# Patient Record
Sex: Male | Born: 1995
Health system: Western US, Academic
[De-identification: ages and names within clinical notes are randomized; demographics above are authoritative.]

## PROBLEM LIST (undated history)

## (undated) DIAGNOSIS — E119 Type 2 diabetes mellitus without complications: Secondary | ICD-10-CM

---

## 2015-01-21 ENCOUNTER — Encounter (HOSPITAL_COMMUNITY): Payer: Self-pay | Admitting: Emergency Medicine

## 2015-01-21 ENCOUNTER — Emergency Department (HOSPITAL_COMMUNITY)
Admission: EM | Admit: 2015-01-21 | Discharge: 2015-01-22 | Disposition: A | Discharge status: Home / Self Care | Attending: Emergency Medicine | Admitting: Emergency Medicine

## 2015-01-21 ENCOUNTER — Emergency Department (HOSPITAL_COMMUNITY)

## 2015-01-21 DIAGNOSIS — J028 Acute pharyngitis due to other specified organisms: Secondary | ICD-10-CM

## 2015-01-21 DIAGNOSIS — J029 Acute pharyngitis, unspecified: Secondary | ICD-10-CM

## 2015-01-21 DIAGNOSIS — Z794 Long term (current) use of insulin: Secondary | ICD-10-CM | POA: Insufficient documentation

## 2015-01-21 DIAGNOSIS — E119 Type 2 diabetes mellitus without complications: Secondary | ICD-10-CM | POA: Diagnosis not present

## 2015-01-21 DIAGNOSIS — B9789 Other viral agents as the cause of diseases classified elsewhere: Secondary | ICD-10-CM

## 2015-01-21 DIAGNOSIS — R1110 Vomiting, unspecified: Secondary | ICD-10-CM | POA: Diagnosis not present

## 2015-01-21 DIAGNOSIS — R05 Cough: Secondary | ICD-10-CM | POA: Diagnosis not present

## 2015-01-21 HISTORY — DX: Type 2 diabetes mellitus without complications: E11.9

## 2015-01-21 LAB — CBC WITH DIFFERENTIAL/PLATELET
Basophils Absolute: 0 K/uL (ref 0.0–0.1)
Basophils Relative: 0 % (ref 0–1)
Eosinophils Absolute: 0 K/uL (ref 0.0–0.7)
Eosinophils Relative: 0 % (ref 0–5)
HCT: 39.9 % (ref 39.0–52.0)
Hemoglobin: 13.4 g/dL (ref 13.0–17.0)
Lymphocytes Relative: 12 % (ref 12–46)
Lymphs Abs: 1.9 K/uL (ref 0.7–4.0)
MCH: 31.8 pg (ref 26.0–34.0)
MCHC: 33.6 g/dL (ref 30.0–36.0)
MCV: 94.8 fL (ref 78.0–100.0)
Monocytes Absolute: 1 K/uL (ref 0.1–1.0)
Monocytes Relative: 6 % (ref 3–12)
Neutro Abs: 13.4 K/uL — ABNORMAL HIGH (ref 1.7–7.7)
Neutrophils Relative %: 82 % — ABNORMAL HIGH (ref 43–77)
Platelets: 392 K/uL (ref 150–400)
RBC: 4.21 MIL/uL — ABNORMAL LOW (ref 4.22–5.81)
RDW: 12.5 % (ref 11.5–15.5)
WBC: 16.4 K/uL — ABNORMAL HIGH (ref 4.0–10.5)

## 2015-01-21 LAB — RAPID STREP SCREEN (MED CTR MEBANE ONLY): Streptococcus, Group A Screen (Direct): NEGATIVE

## 2015-01-21 LAB — COMPREHENSIVE METABOLIC PANEL WITH GFR
ALT: 28 U/L (ref 17–63)
AST: 39 U/L (ref 15–41)
Albumin: 4 g/dL (ref 3.5–5.0)
Alkaline Phosphatase: 98 U/L (ref 38–126)
Anion gap: 16 — ABNORMAL HIGH (ref 5–15)
BUN: 15 mg/dL (ref 6–20)
CO2: 16 mmol/L — ABNORMAL LOW (ref 22–32)
Calcium: 8.6 mg/dL — ABNORMAL LOW (ref 8.9–10.3)
Chloride: 108 mmol/L (ref 101–111)
Creatinine, Ser: 0.96 mg/dL (ref 0.61–1.24)
GFR calc Af Amer: >60 mL/min (ref >60)
GFR calc non Af Amer: >60 mL/min (ref >60)
Glucose, Bld: 162 mg/dL — ABNORMAL HIGH (ref 65–99)
Potassium: 4.9 mmol/L (ref 3.5–5.1)
Sodium: 140 mmol/L (ref 135–145)
Total Bilirubin: 1.2 mg/dL (ref 0.3–1.2)
Total Protein: 7.7 g/dL (ref 6.5–8.1)

## 2015-01-21 MED ORDER — SODIUM CHLORIDE 0.9 % IV BOLUS (SEPSIS)
1000.0000 mL | Freq: Once | INTRAVENOUS | Status: AC
  Administered 2015-01-21: 1000 mL via INTRAVENOUS

## 2015-01-21 MED ORDER — BENZONATATE 100 MG PO CAPS
100.0000 mg | ORAL_CAPSULE | Freq: Once | ORAL | Status: AC
  Administered 2015-01-21: 100 mg via ORAL
  Filled 2015-01-21: qty 1

## 2015-01-21 MED ORDER — LIDOCAINE VISCOUS 2 % MT SOLN
15.0000 mL | Freq: Once | OROMUCOSAL | Status: AC
  Administered 2015-01-21: 15 mL via OROMUCOSAL
  Filled 2015-01-21: qty 15

## 2015-01-21 NOTE — ED Notes (Addendum)
Bed: WA03 Expected date:  Expected time:  Means of arrival:  Comments: EMS 19 yo male sore throat, nausea and vomiting, CBG elevated 2 liters fluid

## 2015-01-21 NOTE — ED Notes (Signed)
Attempted lab draw x 2 but unsuccessful.RN, Jari Favre made aware.

## 2015-01-21 NOTE — ED Provider Notes (Signed)
CSN: [id number]     Arrival date & time 01/21/15  2101 History   First MD Initiated Contact with Patient 01/21/15 2110     Chief Complaint  Patient presents with  . Sore Throat     (Consider location/radiation/quality/duration/timing/severity/associated sxs/prior Treatment) Patient is a 19 y.o. male presenting with pharyngitis. The history is provided by the patient. No language interpreter was used.  Sore Throat This is a new problem. The problem has been gradually worsening. Associated symptoms include coughing, headaches, a sore throat and vomiting. Pertinent negatives include no abdominal pain, chest pain, chills or fever.  Mr. Harriott is a 19 y.o male with a history of DM who presents by EMS for a constant and worsening sore throat since waking up this morning and several episodes of vomiting. He is also complaining of a productive, yellow sputum cough and runny nose for the past 7 days. He denies any treatment prior to arrival. Nothing makes his symptoms better or worse. EMS reports his CBG was 174. He denies any fever, chills, drooling, trouble breathing, shortness of breath, abdominal pain, hematemesis, diarrhea, dysuria, urinary frequency. He smokes 4 cigarettes a day.  Past Medical History  Diagnosis Date  . Diabetes mellitus without complication    History reviewed. No pertinent past surgical history. History reviewed. No pertinent family history. History  Substance Use Topics  . Smoking status: Never Smoker   . Smokeless tobacco: Not on file  . Alcohol Use: No    Review of Systems  Constitutional: Negative for fever and chills.  HENT: Positive for rhinorrhea and sore throat.   Respiratory: Positive for cough. Negative for shortness of breath.   Cardiovascular: Negative for chest pain.  Gastrointestinal: Positive for vomiting. Negative for abdominal pain.  Genitourinary: Negative for dysuria.  Neurological: Positive for headaches.  All other systems reviewed and are  negative.     Allergies  Review of patient's allergies indicates no known allergies.  Home Medications   Prior to Admission medications   Medication Sig Start Date End Date Taking? Authorizing Provider  guaiFENesin (MUCINEX) 600 MG 12 hr tablet Take 600 mg by mouth 2 (two) times daily as needed for cough or to loosen phlegm.   Yes Historical Provider, MD  insulin glargine (LANTUS) 100 UNIT/ML injection Inject 24 Units into the skin at bedtime.   Yes Historical Provider, MD  insulin lispro (HUMALOG) 100 UNIT/ML injection Inject 1-6 Units into the skin 3 (three) times daily before meals. Take 1 unit per 10 grams of food.   Yes Historical Provider, MD  Pseudoephedrine-APAP-DM (DAYQUIL PO) Take 2 capsules by mouth daily as needed (runny nose).   Yes Historical Provider, MD   BP 127/64 mmHg  Pulse 89  Temp(Src) 99.2 F (37.3 C) (Oral)  Resp 18  SpO2 100% Physical Exam  Constitutional: He is oriented to person, place, and time. He appears well-developed and well-nourished. No distress.  HENT:  Head: Normocephalic and atraumatic.  Mouth/Throat: Uvula is midline and mucous membranes are normal. No trismus in the jaw. No uvula swelling. Posterior oropharyngeal erythema present. No oropharyngeal exudate, posterior oropharyngeal edema or tonsillar abscesses.  Eyes: Conjunctivae and EOM are normal.  Neck: Normal range of motion and full passive range of motion without pain. Neck supple.  No anterior cervical lymphadenopathy.  Cardiovascular: Normal rate, regular rhythm and normal heart sounds.   Pulmonary/Chest: Effort normal. No respiratory distress. He has decreased breath sounds in the right lower field and the left lower field. He has no wheezes.  Abdominal: Soft. He exhibits no distension. There is no tenderness. There is no rebound and no guarding.  Musculoskeletal: Normal range of motion.  Neurological: He is alert and oriented to person, place, and time.  Skin: Skin is warm and dry.   Psychiatric: He has a normal mood and affect. His behavior is normal.  Nursing note and vitals reviewed.   ED Course  Procedures (including critical care time) Labs Review Labs Reviewed  CBC WITH DIFFERENTIAL/PLATELET - Abnormal; Notable for the following:    WBC 16.4 (*)    RBC 4.21 (*)    Neutrophils Relative % 82 (*)    Neutro Abs 13.4 (*)    All other components within normal limits  COMPREHENSIVE METABOLIC PANEL - Abnormal; Notable for the following:    CO2 16 (*)    Glucose, Bld 162 (*)    Calcium 8.6 (*)    Anion gap 16 (*)    All other components within normal limits  RAPID STREP SCREEN (NOT AT [facility])  CULTURE, GROUP A STREP    Imaging Review Dg Chest 2 View  01/21/2015   CLINICAL DATA:  Nausea vomiting and sore throat.  Hyperglycemia.  EXAM: CHEST  2 VIEW  COMPARISON:  None.  FINDINGS: The heart size and mediastinal contours are within normal limits. Both lungs are clear. The visualized skeletal structures are unremarkable.  IMPRESSION: No active cardiopulmonary disease.   Electronically Signed   By: Ellery Plunk M.D.   On: 01/21/2015 22:01     EKG Interpretation None      MDM   Final diagnoses:  Acute viral pharyngitis   Patient is a diabetic who presents for nausea, vomiting, sore throat and URI symptoms. His vitals are stable and he is well appearing.  His anion gap is 16.   Medications  sodium chloride 0.9 % bolus 1,000 mL (0 mLs Intravenous Stopped 01/21/15 2306)  lidocaine (XYLOCAINE) 2 % viscous mouth solution 15 mL (15 mLs Mouth/Throat Given 01/21/15 2306)  benzonatate (TESSALON) capsule 100 mg (100 mg Oral Given 01/21/15 2306)  Recheck: He is feeling better after fluids.  His chest xray is negative for pneumonia. Strep is negative. He most likely has viral pharyngitis. He can take tylenol or motrin for fever. I discussed drinking fluids.  I gave him a work note as requested.  He can follow up with [facility] or the [facility] since  his pcp is in [place].  He agrees with the plan.     Catha Gosselin, PA-C 01/22/15 1610  Rolland Porter, MD 01/26/15 [phone]

## 2015-01-21 NOTE — Discharge Instructions (Signed)
Pharyngitis Use OTC chloraseptic spray for throat pain. Return for difficulty breathing or swallowing. Follow up with [facility].  Pharyngitis is a sore throat (pharynx). There is redness, pain, and swelling of your throat. HOME CARE   Drink enough fluids to keep your pee (urine) clear or pale yellow.  Only take medicine as told by your doctor.  You may get sick again if you do not take medicine as told. Finish your medicines, even if you start to feel better.  Do not take aspirin.  Rest.  Rinse your mouth (gargle) with salt water ( tsp of salt per 1 qt of water) every 1-2 hours. This will help the pain.  If you are not at risk for choking, you can suck on hard candy or sore throat lozenges. GET HELP IF:  You have large, tender lumps on your neck.  You have a rash.  You cough up green, yellow-brown, or bloody spit. GET HELP RIGHT AWAY IF:   You have a stiff neck.  You drool or cannot swallow liquids.  You throw up (vomit) or are not able to keep medicine or liquids down.  You have very bad pain that does not go away with medicine.  You have problems breathing (not from a stuffy nose). MAKE SURE YOU:   Understand these instructions.  Will watch your condition.  Will get help right away if you are not doing well or get worse. Document Released: 11/22/2007 Document Revised: 03/26/2013 Document Reviewed: 02/10/2013 [facility] Patient Information 2015 [place], [place]. This information is not intended to replace advice given to you by your health care provider. Make sure you discuss any questions you have with your health care provider.

## 2015-01-21 NOTE — ED Notes (Signed)
PER EMS- [facility] on [address] c/o n/v and sore throat. Possible hyperglycemia.  Current CBG 174. Pt arrived to ED alert and oriented x4

## 2015-01-24 LAB — CULTURE, GROUP A STREP: Strep A Culture: NEGATIVE

## 2016-05-17 DIAGNOSIS — E083291 Diabetes mellitus due to underlying condition with mild nonproliferative diabetic retinopathy without macular edema, right eye: Secondary | ICD-10-CM | POA: Diagnosis not present

## 2016-05-17 DIAGNOSIS — H5213 Myopia, bilateral: Secondary | ICD-10-CM | POA: Diagnosis not present

## 2016-05-17 DIAGNOSIS — E103291 Type 1 diabetes mellitus with mild nonproliferative diabetic retinopathy without macular edema, right eye: Secondary | ICD-10-CM | POA: Diagnosis not present

## 2016-07-01 ENCOUNTER — Encounter (HOSPITAL_COMMUNITY): Payer: Self-pay | Admitting: Emergency Medicine

## 2016-07-01 ENCOUNTER — Ambulatory Visit (HOSPITAL_COMMUNITY)
Admission: EM | Admit: 2016-07-01 | Discharge: 2016-07-01 | Disposition: A | Discharge status: Home / Self Care | Payer: BLUE CROSS/BLUE SHIELD | Attending: Internal Medicine | Admitting: Internal Medicine

## 2016-07-01 DIAGNOSIS — R739 Hyperglycemia, unspecified: Secondary | ICD-10-CM

## 2016-07-01 DIAGNOSIS — R05 Cough: Secondary | ICD-10-CM

## 2016-07-01 DIAGNOSIS — R059 Cough, unspecified: Secondary | ICD-10-CM

## 2016-07-01 DIAGNOSIS — B349 Viral infection, unspecified: Secondary | ICD-10-CM

## 2016-07-01 LAB — GLUCOSE, CAPILLARY: Glucose-Capillary: 309 mg/dL — ABNORMAL HIGH (ref 65–99)

## 2016-07-01 NOTE — ED Triage Notes (Signed)
[facility] a [facility] of Nausea and a headache x 3 days.

## 2016-07-01 NOTE — ED Provider Notes (Signed)
CSN: [id number]     Arrival date & time 07/01/16  1741 History   First MD Initiated Contact with Patient 07/01/16 1953     Chief Complaint  Patient presents with  . Headache   (Consider location/radiation/quality/duration/timing/severity/associated sxs/prior Treatment) 21 yo black male with a history of IDDM presents with 3 days of vague symptoms including non-productive cough, subjective fever "sometimes", headache and nausea once today. He feels better at time of presentation. He states that he is eating and drinking well. He denies sinus symptoms. See remainder of detailed ROS.       Past Medical History:  Diagnosis Date  . Diabetes mellitus without complication (HCC)    History reviewed. No pertinent surgical history. History reviewed. No pertinent family history. Social History  Substance Use Topics  . Smoking status: Never Smoker  . Smokeless tobacco: Not on file  . Alcohol use No    Review of Systems  Constitutional: Positive for fatigue and fever.  HENT: Negative for congestion, sinus pain, sinus pressure and sore throat.   Eyes: Negative.   Respiratory: Positive for cough. Negative for shortness of breath and wheezing.   Cardiovascular: Negative.   Gastrointestinal: Positive for nausea. Negative for constipation, diarrhea and vomiting.  Genitourinary: Negative.   Musculoskeletal: Negative.   Neurological: Positive for headaches. Negative for dizziness, seizures, light-headedness and numbness.  Psychiatric/Behavioral: Negative.     Allergies  Patient has no known allergies.  Home Medications   Prior to Admission medications   Medication Sig Start Date End Date Taking? Authorizing Provider  insulin glargine (LANTUS) 100 UNIT/ML injection Inject 24 Units into the skin at bedtime.   Yes Historical Provider, MD  insulin lispro (HUMALOG) 100 UNIT/ML injection Inject 1-6 Units into the skin 3 (three) times daily before meals. Take 1 unit per 10 grams of food.   Yes  Historical Provider, MD   Meds Ordered and Administered this Visit  Medications - No data to display  BP 108/69   Pulse 92   Temp 98.7 F (37.1 C) (Oral)   Resp 16   SpO2 98%  No data found.   Physical Exam  Constitutional: He is oriented to person, place, and time. He appears well-developed and well-nourished. No distress.  Alert and in NAD. Upright and playing a video game in bed.   HENT:  Head: Normocephalic and atraumatic.  Right Ear: External ear normal.  Left Ear: External ear normal.  Mouth/Throat: Oropharynx is clear and moist.  Neck: Normal range of motion.  Cardiovascular: Normal rate and regular rhythm.   Pulmonary/Chest: Effort normal and breath sounds normal. No respiratory distress. He has no wheezes.  Abdominal: Soft. Bowel sounds are normal. He exhibits no distension. There is no tenderness. There is no guarding.  Lymphadenopathy:    He has no cervical adenopathy.  Neurological: He is alert and oriented to person, place, and time.  Skin: Skin is warm and dry. He is not diaphoretic.  Good skin turgor  Psychiatric: His behavior is normal.  Nursing note and vitals reviewed.   Urgent Care Course   Clinical Course     Procedures (including critical care time)  Labs Review Labs Reviewed  GLUCOSE, CAPILLARY - Abnormal; Notable for the following:       Result Value   Glucose-Capillary 309 (*)    All other components within normal limits    Imaging Review No results found.   Visual Acuity Review  Right Eye Distance:   Left Eye Distance:   Bilateral Distance:  Right Eye Near:   Left Eye Near:    Bilateral Near:         MDM   1. Viral illness   2. Cough   3. Hyperglycemia    Review of Orthostatics were non-revealing. In fact repeat BP normal. I stat with elevated glucose (prior to insulin) otherwise normal. Exam normal. At this time time suspect a viral illness, no further etiology found. No emergent symptoms. Treat symptomatically and  f/u if worsens. Stable for D/C.     Riki Sheer, PA-C 07/01/16 2138

## 2016-07-01 NOTE — Discharge Instructions (Signed)
You appear to have a viral illness given symptoms. Treat with fluids and rest. Ibuprofen or Tylenol for headaches and achiness. FU with your PCP regarding your elevated blood sugars. May use Delsym for cough.

## 2016-07-08 NOTE — Progress Notes (Signed)
Subjective:    Patient ID: Scott Bush, male    DOB: Sep 14, 1995, 21 y.o.   MRN: [id number]  HPI pt is self-referred for diabetes.  Pt states DM was dx'ed in 2007; he has mild if any neuropathy of the lower extremities; he is unaware of any associated chronic complications; he has been on insulin since dx; pt says his diet and exercise are improved recently; he has never had pancreatitis.  He works as a Leisure centre managerbartender.  Last episode of severe hypoglycemia was in 2008.  He has had DKA only once, at the time of dx.  He has been out of his insulin x 2 days, but he has ins now.   Past Medical History:  Diagnosis Date  . Diabetes mellitus without complication (HCC)     No past surgical history on file.  Social History   Social History  . Marital status: Single    Spouse name: N/A  . Number of children: N/A  . Years of education: N/A   Occupational History  . Not on file.   Social History Main Topics  . Smoking status: Never Smoker  . Smokeless tobacco: Never Used  . Alcohol use No  . Drug use: Unknown  . Sexual activity: Not on file   Other Topics Concern  . Not on file   Social History Narrative  . No narrative on file    No current outpatient prescriptions on file prior to visit.   No current facility-administered medications on file prior to visit.     Allergies  Allergen Reactions  . Tomato Swelling  . Banana     GI upset   . Other     Blue cheese---Swelling     Family History  Problem Relation Age of Onset  . Diabetes type I Sister     BP 110/80   Pulse 96   Ht 5' 11.5" (1.816 m)   Wt 145 lb (65.8 kg)   SpO2 97%   BMI 19.94 kg/m    Review of Systems denies blurry vision, headache, chest pain, sob, n/v, muscle cramps, excessive diaphoresis, depression, cold intolerance, rhinorrhea, and easy bruising.  He has lost 25 lbs x 3 mos.  He has frequent urination.     Objective:   Physical Exam VS: see vs page GEN: no distress HEAD: head: no deformity eyes:  no periorbital swelling, no proptosis external nose and ears are normal mouth: no lesion seen NECK: supple, thyroid is not enlarged CHEST WALL: no deformity LUNGS: clear to auscultation CV: reg rate and rhythm, no murmur ABD: abdomen is soft, nontender.  no hepatosplenomegaly.  not distended.  no hernia MUSCULOSKELETAL: muscle bulk and strength are grossly normal.  no obvious joint swelling.  gait is normal and steady EXTEMITIES: no deformity.  no ulcer on the feet.  feet are of normal color and temp.  no edema PULSES: dorsalis pedis intact bilat.  no carotid bruit NEURO:  cn 2-12 grossly intact.   readily moves all 4's.  sensation is intact to touch on the feet SKIN:  Normal texture and temperature.  No rash or suspicious lesion is visible.  Insulin injection sites at the anterior abdomen are normal.   NODES:  None palpable at the neck.  PSYCH: alert, well-oriented.  Does not appear anxious nor depressed.    I have reviewed outside records, and summarized: Pt was noted in 2013 to have elevated a1c, but compliance was noted to be much better.  He was noted to be  in college then.   Lab Results  Component Value Date   CREATININE 0.96 01/21/2015   BUN 15 01/21/2015   NA 140 01/21/2015   K 4.9 01/21/2015   CL 108 01/21/2015   CO2 16 (L) 01/21/2015       Assessment & Plan:  Type 1 DM, new to me: poor control. Noncompliance with insulin, apparently due to cost.  Patient is advised the following: Patient Instructions  good diet and exercise significantly improve the control of your diabetes.  please let me know if you wish to be referred to a dietician.  high blood sugar is very risky to your health.  you should see an eye doctor and dentist every year.  It is very important to get all recommended vaccinations.  Controlling your blood pressure and cholesterol drastically reduces the damage diabetes does to your body.  Those who smoke should quit.  Please discuss these with your doctor.    check your blood sugar 4 times a day: before the 3 meals, and at bedtime.  also check if you have symptoms of your blood sugar being too high or too low.  please keep a record of the readings and bring it to your next appointment here (or you can bring the meter itself).  You can write it on any piece of paper.  please call us sooner if your blood sugar goes below 70, or if you have a lot of readings over 200.  I have sent prescriptions to your pharmacy, to resume the insulins.   Please come back for a follow-up appointment in 1 month.

## 2016-07-11 ENCOUNTER — Encounter: Payer: Self-pay | Admitting: Endocrinology

## 2016-07-11 ENCOUNTER — Ambulatory Visit (INDEPENDENT_AMBULATORY_CARE_PROVIDER_SITE_OTHER): Payer: BLUE CROSS/BLUE SHIELD | Admitting: Endocrinology

## 2016-07-11 DIAGNOSIS — E109 Type 1 diabetes mellitus without complications: Secondary | ICD-10-CM | POA: Diagnosis not present

## 2016-07-11 DIAGNOSIS — F4311 Post-traumatic stress disorder, acute: Secondary | ICD-10-CM | POA: Diagnosis not present

## 2016-07-11 MED ORDER — GLUCAGON (RDNA) 1 MG IJ KIT: 1.0000 mg | PACK | Freq: Once | INTRAMUSCULAR | 12 refills | Status: AC | PRN

## 2016-07-11 MED ORDER — INSULIN ASPART 100 UNIT/ML FLEXPEN
PEN_INJECTOR | SUBCUTANEOUS | 11 refills | Status: DC
Start: 2016-07-11 — End: 2016-10-09

## 2016-07-11 MED ORDER — BAYER CONTOUR NEXT MONITOR W/DEVICE KIT: 1.0000 | PACK | Freq: Once | 0 refills | Status: AC

## 2016-07-11 MED ORDER — INSULIN GLARGINE 100 UNIT/ML SOLOSTAR PEN: 22.0000 [IU] | PEN_INJECTOR | Freq: Every day | SUBCUTANEOUS | 99 refills | Status: DC

## 2016-07-11 MED ORDER — GLUCOSE BLOOD VI STRP: 1.0000 | ORAL_STRIP | Freq: Four times a day (QID) | 3 refills | Status: DC

## 2016-07-11 NOTE — Patient Instructions (Addendum)
good diet and exercise significantly improve the control of your diabetes.  please let me know if you wish to be referred to a dietician.  high blood sugar is very risky to your health.  you should see an eye doctor and dentist every year.  It is very important to get all recommended vaccinations.  Controlling your blood pressure and cholesterol drastically reduces the damage diabetes does to your body.  Those who smoke should quit.  Please discuss these with your doctor.  check your blood sugar 4 times a day: before the 3 meals, and at bedtime.  also check if you have symptoms of your blood sugar being too high or too low.  please keep a record of the readings and bring it to your next appointment here (or you can bring the meter itself).  You can write it on any piece of paper.  please call us sooner if your blood sugar goes below 70, or if you have a lot of readings over 200.  I have sent prescriptions to your pharmacy, to resume the insulins.   Please come back for a follow-up appointment in 1 month.

## 2016-07-13 DIAGNOSIS — E119 Type 2 diabetes mellitus without complications: Secondary | ICD-10-CM | POA: Insufficient documentation

## 2016-07-26 DIAGNOSIS — F331 Major depressive disorder, recurrent, moderate: Secondary | ICD-10-CM | POA: Diagnosis not present

## 2016-08-10 ENCOUNTER — Ambulatory Visit: Payer: BLUE CROSS/BLUE SHIELD | Admitting: Endocrinology

## 2016-08-15 DIAGNOSIS — F331 Major depressive disorder, recurrent, moderate: Secondary | ICD-10-CM | POA: Diagnosis not present

## 2016-09-01 ENCOUNTER — Ambulatory Visit (INDEPENDENT_AMBULATORY_CARE_PROVIDER_SITE_OTHER): Payer: BLUE CROSS/BLUE SHIELD | Admitting: Endocrinology

## 2016-09-01 ENCOUNTER — Encounter: Payer: Self-pay | Admitting: Endocrinology

## 2016-09-01 VITALS — BP 122/68 | HR 111 | Wt 152.8 lb

## 2016-09-01 DIAGNOSIS — E119 Type 2 diabetes mellitus without complications: Secondary | ICD-10-CM

## 2016-09-01 LAB — POCT GLYCOSYLATED HEMOGLOBIN (HGB A1C): Hemoglobin A1C: 11

## 2016-09-01 NOTE — Patient Instructions (Addendum)
check your blood sugar 4 times a day: before the 3 meals, and at bedtime.  also check if you have symptoms of your blood sugar being too high or too low.  please keep a record of the readings and bring it to your next appointment here (or you can bring the meter itself).  You can write it on any piece of paper.  please call us sooner if your blood sugar goes below 70, or if you have a lot of readings over 200.  Please resume the insulins. Here is some information on how to get the lantus without a copay.  If there is an alternative to either insulin you can get cheaper, call us.  We'll change if we can.   Here is a new meter.  I have sent a prescription to your pharmacy, for strips.   Please come back for a follow-up appointment in 2 months.

## 2016-09-01 NOTE — Progress Notes (Signed)
Subjective:    Patient ID: Scott Bush, male    DOB: [birth year], 21 y.o.   MRN: [id number]  HPI Pt returns for f/u of diabetes mellitus: DM type: 1 Dx'ed: 2007 Complications: none Therapy: insulin since dx DKA: only once, at dx Severe hypoglycemia: last episode was 2008 Pancreatitis: never Other: he takes multiple daily injections Interval history: He ran out of insulin 2 days ago.  He says he does not have the copay to refill.   Past Medical History:  Diagnosis Date  . Diabetes mellitus without complication (HCC)     No past surgical history on file.  Social History   Social History  . Marital status: Single    Spouse name: N/A  . Number of children: N/A  . Years of education: N/A   Occupational History  . Not on file.   Social History Main Topics  . Smoking status: Never Smoker  . Smokeless tobacco: Never Used  . Alcohol use No  . Drug use: Unknown  . Sexual activity: Not on file   Other Topics Concern  . Not on file   Social History Narrative  . No narrative on file    Current Outpatient Prescriptions on File Prior to Visit  Medication Sig Dispense Refill  . glucagon (GLUCAGON EMERGENCY) 1 MG injection Inject 1 mg into the muscle once as needed. 1 each 12  . glucose blood (BAYER CONTOUR TEST) test strip 1 each by Other route 4 (four) times daily. And lancets 4/day 400 each 3  . insulin aspart (NOVOLOG FLEXPEN) 100 UNIT/ML FlexPen 1 unit per 10 grams carbohydrate (total 25 units/day), and pen needles 4/day 15 mL 11  . Insulin Glargine (LANTUS SOLOSTAR) 100 UNIT/ML Solostar Pen Inject 22 Units into the skin at bedtime. 5 pen PRN   No current facility-administered medications on file prior to visit.     Allergies  Allergen Reactions  . Tomato Swelling  . Banana     GI upset   . Other     Blue cheese---Swelling     Family History  Problem Relation Age of Onset  . Diabetes type I Sister     BP 122/68 (BP Location: Left Arm, Patient Position:  Sitting, Cuff Size: Normal)   Pulse (!) 111   Wt 152 lb 12.8 oz (69.3 kg)   SpO2 97%   BMI 21.01 kg/m   Review of Systems He denies hypoglycemia.     Objective:   Physical Exam VITAL SIGNS:  See vs page GENERAL: no distress Pulses: dorsalis pedis intact bilat.   MSK: no deformity of the feet CV: no leg edema Skin:  no ulcer on the feet.  normal color and temp on the feet. Neuro: sensation is intact to touch on the feet.    a1c=11%     Assessment & Plan:  Type 1 DM: poor control Noncompliance with cbg recording and insulin, recurrent. Patient is advised the following: Patient Instructions  check your blood sugar 4 times a day: before the 3 meals, and at bedtime.  also check if you have symptoms of your blood sugar being too high or too low.  please keep a record of the readings and bring it to your next appointment here (or you can bring the meter itself).  You can write it on any piece of paper.  please call us sooner if your blood sugar goes below 70, or if you have a lot of readings over 200.  Please resume the insulins. Here is  some information on how to get the lantus without a copay.  If there is an alternative to either insulin you can get cheaper, call us.  We'll change if we can.   Here is a new meter.  I have sent a prescription to your pharmacy, for strips.   Please come back for a follow-up appointment in 2 months.

## 2016-09-06 DIAGNOSIS — F331 Major depressive disorder, recurrent, moderate: Secondary | ICD-10-CM | POA: Diagnosis not present

## 2016-09-13 DIAGNOSIS — F331 Major depressive disorder, recurrent, moderate: Secondary | ICD-10-CM | POA: Diagnosis not present

## 2016-10-02 ENCOUNTER — Ambulatory Visit: Payer: BLUE CROSS/BLUE SHIELD | Admitting: Endocrinology

## 2016-10-03 ENCOUNTER — Telehealth: Payer: Self-pay | Admitting: Endocrinology

## 2016-10-03 NOTE — Telephone Encounter (Signed)
Patient no showed today's appt. Please advise on how to follow up. °A. No follow up necessary. °B. Follow up urgent. Contact patient immediately. °C. Follow up necessary. Contact patient and schedule visit in ___ days. °D. Follow up advised. Contact patient and schedule visit in ____weeks. ° °

## 2016-10-03 NOTE — Telephone Encounter (Signed)
Please come back for a follow-up appointment in 1 month.  

## 2016-10-09 ENCOUNTER — Telehealth: Payer: Self-pay | Admitting: Endocrinology

## 2016-10-09 ENCOUNTER — Other Ambulatory Visit: Payer: Self-pay

## 2016-10-09 MED ORDER — INSULIN ASPART 100 UNIT/ML FLEXPEN: 35.0000 [IU] | PEN_INJECTOR | Freq: Three times a day (TID) | SUBCUTANEOUS | 11 refills | Status: DC

## 2016-10-09 NOTE — Telephone Encounter (Signed)
° ° °  insulin aspart (NOVOLOG FLEXPEN) 100 UNIT/ML FlexPen  Insulin Glargine (LANTUS SOLOSTAR) 100 UNIT/ML Solostar Pen 5 pen

## 2016-10-09 NOTE — Telephone Encounter (Signed)
Patient notified of rx being submitted.

## 2016-10-09 NOTE — Telephone Encounter (Signed)
Patient called requesting a refill on his novolog.. Patient is also asking for the dosage to be increased from 25 to 35 units. Patient stated he has been using more per the carbohydrates he is eating.

## 2016-10-09 NOTE — Telephone Encounter (Signed)
Ok, I have sent a prescription to your pharmacy 

## 2016-10-18 DIAGNOSIS — F331 Major depressive disorder, recurrent, moderate: Secondary | ICD-10-CM | POA: Diagnosis not present

## 2016-11-01 ENCOUNTER — Ambulatory Visit: Payer: BLUE CROSS/BLUE SHIELD | Admitting: Endocrinology

## 2016-11-01 DIAGNOSIS — F331 Major depressive disorder, recurrent, moderate: Secondary | ICD-10-CM | POA: Diagnosis not present

## 2016-11-15 DIAGNOSIS — F331 Major depressive disorder, recurrent, moderate: Secondary | ICD-10-CM | POA: Diagnosis not present

## 2016-12-11 ENCOUNTER — Ambulatory Visit: Payer: BLUE CROSS/BLUE SHIELD | Admitting: Endocrinology

## 2017-11-23 ENCOUNTER — Other Ambulatory Visit: Payer: Self-pay

## 2017-11-23 MED ORDER — INSULIN PEN NEEDLE 32G X 4 MM MISC: 0 refills | Status: DC

## 2018-01-29 ENCOUNTER — Other Ambulatory Visit: Payer: Self-pay | Admitting: Endocrinology

## 2018-01-31 ENCOUNTER — Telehealth: Payer: Self-pay | Admitting: Internal Medicine

## 2018-01-31 MED ORDER — INSULIN ASPART 100 UNIT/ML FLEXPEN: 35.0000 [IU] | PEN_INJECTOR | Freq: Three times a day (TID) | SUBCUTANEOUS | 0 refills | Status: DC

## 2018-01-31 NOTE — Telephone Encounter (Signed)
FYI.  Pt called and requested refill on his novolog insulin.  Reviewed chart.  He has not been seen since 08/2016.  States he went to pharmacy and there was a note to not refill the medication.  Was calling to see if could get refill tonight.  Discussed with him that he has not been seen since 08/2016  Has cancelled multiple appointments.  Discussed need for f/u appt.  Explained unable to override his regular physician.  He is currently at work.  Unable to get off now to get medication.  Discussed the need for him to call in am to schedule appt asap.  He stated he would call.  Explained to him that I would d/w Dr Everardo AllEllison regarding refill until his scheduled appt.    Addendum. Spoke to Dr Everardo AllEllison this pm regarding above situation.  Reviewed records.  Agreed for one time refill.  [facility]  Pt to call and schedule appt.  Tried calling pt multiple times to let him know that a one time insulin rx has been sent in, but that he needed to make and keep appt.  Unable to reach or leave message for pt.  Will continue to try to contact pt.

## 2018-01-31 NOTE — Telephone Encounter (Signed)
Called pt again.  Informed him that [facility].  Also informed him that he needed to call the office in the am and schedule an appt asap.  States he would call.

## 2018-02-01 ENCOUNTER — Telehealth: Payer: Self-pay | Admitting: Endocrinology

## 2018-02-01 ENCOUNTER — Telehealth: Payer: Self-pay

## 2018-02-01 NOTE — Telephone Encounter (Signed)
Per [facility]-Caller states he has been without insulin for 3 days and needs to get it from pharmacy. States pharmacy has RX for insulin, but that Romero BellingSean Ellison had put a "halt" on it and pharmacy can't fill until this is lifted. Declined triage, Blood sugar currently 278.  RN-States he takes Novolog and Lantus and that pharmacy is the [facility] on record in his file. Paging Dr. Westley Hummerharlene Scott-States caller has not been seen in office since March 2018. Instructed to advise pt to go to UC or ED to obtain insulin for use until he can be seen by Dr. Shanon Rosserannot prescribe medication without seeing patient for so long

## 2018-02-01 NOTE — Telephone Encounter (Signed)
please call patient: He needs a warning call that he will be dismissed if he continues to not come for f/u appts.  Thank you.

## 2018-02-01 NOTE — Telephone Encounter (Signed)
Patient called to set up an appointment and he wanted Dr. Everardo AllEllison to know he has an appointment scheduled so there is not an interruption in medication

## 2018-02-04 ENCOUNTER — Other Ambulatory Visit: Payer: Self-pay

## 2018-02-04 MED ORDER — INSULIN ASPART 100 UNIT/ML FLEXPEN: 35.0000 [IU] | PEN_INJECTOR | Freq: Three times a day (TID) | SUBCUTANEOUS | 0 refills | Status: DC

## 2018-02-04 MED ORDER — INSULIN GLARGINE 100 UNIT/ML SOLOSTAR PEN: 22.0000 [IU] | PEN_INJECTOR | Freq: Every day | SUBCUTANEOUS | 99 refills | Status: DC

## 2018-02-04 NOTE — Telephone Encounter (Signed)
See my previous note

## 2018-02-04 NOTE — Telephone Encounter (Signed)
I have sent 30 day & patient has been warned of dismissal if he misses again.

## 2018-02-04 NOTE — Telephone Encounter (Signed)
I have spoken with patient & he has appointment 9/20. He was only sent a temporary 14 day prescription & that wouldn't last him long enough. I have sent a 30 day & patient is aware if he misses again he will be dismissed from practice.

## 2018-03-08 ENCOUNTER — Ambulatory Visit (INDEPENDENT_AMBULATORY_CARE_PROVIDER_SITE_OTHER): Payer: BLUE CROSS/BLUE SHIELD | Admitting: Endocrinology

## 2018-03-08 ENCOUNTER — Encounter: Payer: Self-pay | Admitting: Endocrinology

## 2018-03-08 VITALS — BP 122/76 | HR 71 | Ht 71.5 in | Wt 152.4 lb

## 2018-03-08 DIAGNOSIS — E119 Type 2 diabetes mellitus without complications: Secondary | ICD-10-CM

## 2018-03-08 LAB — POCT GLYCOSYLATED HEMOGLOBIN (HGB A1C): HEMOGLOBIN A1C: 9.9 % — AB (ref 4.0–5.6)

## 2018-03-08 MED ORDER — FREESTYLE LIBRE 14 DAY SENSOR MISC: 1.0000 | 3 refills | Status: DC

## 2018-03-08 MED ORDER — FREESTYLE LIBRE 14 DAY READER DEVI: 1.0000 | Freq: Once | 0 refills | Status: AC

## 2018-03-08 MED ORDER — INSULIN ASPART 100 UNIT/ML FLEXPEN: 25.0000 [IU] | PEN_INJECTOR | Freq: Three times a day (TID) | SUBCUTANEOUS | 0 refills | Status: DC

## 2018-03-08 MED ORDER — INSULIN GLARGINE 100 UNIT/ML SOLOSTAR PEN: 15.0000 [IU] | PEN_INJECTOR | Freq: Every day | SUBCUTANEOUS | 99 refills | Status: DC

## 2018-03-08 NOTE — Progress Notes (Signed)
   Subjective:    Patient ID: Scott Bush, male    DOB: Apr 12, 1996, 22 y.o.   MRN: [id number]  HPI Pt returns for f/u of diabetes mellitus: DM type: 1 Dx'ed: 2007 Complications: none Therapy: insulin since dx DKA: only once, at dx Severe hypoglycemia: last episode was 2008 Pancreatitis: never Other: he takes multiple daily injections Interval history: he takes lantus 24 units qd, and novolog, 1 unit/10 g CHO (averages 12-15 units 3 times a day (just before each meal).  no cbg record, but states cbg's are mildly low approx once per day. This usually happens fasting. Pt says he sometimes misses the insulin.  He resumed lantus 1 month ago--was unable to afford it.     Review of Systems He denies LOC    Objective:   Physical Exam VITAL SIGNS:  See vs page GENERAL: no distress Pulses: foot pulses are intact bilaterally.   MSK: no deformity of the feet or ankles.  CV: no edema of the legs or ankles Skin:  no ulcer on the feet or ankles.  normal color and temp on the feet and ankles Neuro: sensation is intact to touch on the feet and ankles.    A1c=9.9%     Assessment & Plan:  Type 1 DM: he needs increased rx Hypoglycemia: The pattern of his cbg's indicates he needs some adjustment in his therapy Noncompliance with insulin: as he recently resumed the lantus, glycemic control is prob better than this a1c suggests.  Patient Instructions  check your blood sugar 4 times a day: before the 3 meals, and at bedtime.  also check if you have symptoms of your blood sugar being too high or too low.  please keep a record of the readings and bring it to your next appointment here (or you can bring the meter itself).  You can write it on any piece of paper.  please call us sooner if your blood sugar goes below 70, or if you have a lot of readings over 200.  Please reduce the lantus to 15 units at bedtime, and: Increase the novolog to 1 unit/8 grams of carbohydrate.  Please come back for a follow-up  appointment in 2 months.

## 2018-03-08 NOTE — Patient Instructions (Addendum)
check your blood sugar 4 times a day: before the 3 meals, and at bedtime.  also check if you have symptoms of your blood sugar being too high or too low.  please keep a record of the readings and bring it to your next appointment here (or you can bring the meter itself).  You can write it on any piece of paper.  please call us sooner if your blood sugar goes below 70, or if you have a lot of readings over 200.  Please reduce the lantus to 15 units at bedtime, and: Increase the novolog to 1 unit/8 grams of carbohydrate.  Please come back for a follow-up appointment in 2 months.

## 2018-05-08 ENCOUNTER — Ambulatory Visit: Payer: BLUE CROSS/BLUE SHIELD | Admitting: Endocrinology

## 2018-05-08 ENCOUNTER — Ambulatory Visit (INDEPENDENT_AMBULATORY_CARE_PROVIDER_SITE_OTHER): Payer: BLUE CROSS/BLUE SHIELD | Admitting: Endocrinology

## 2018-05-08 ENCOUNTER — Encounter: Payer: Self-pay | Admitting: Endocrinology

## 2018-05-08 ENCOUNTER — Telehealth: Payer: Self-pay | Admitting: Endocrinology

## 2018-05-08 ENCOUNTER — Encounter: Payer: BLUE CROSS/BLUE SHIELD | Attending: Endocrinology | Admitting: Nutrition

## 2018-05-08 VITALS — BP 122/70 | HR 67 | Ht 71.5 in | Wt 153.8 lb

## 2018-05-08 DIAGNOSIS — Z23 Encounter for immunization: Secondary | ICD-10-CM

## 2018-05-08 DIAGNOSIS — E119 Type 2 diabetes mellitus without complications: Secondary | ICD-10-CM

## 2018-05-08 DIAGNOSIS — E0801 Diabetes mellitus due to underlying condition with hyperosmolarity with coma: Secondary | ICD-10-CM

## 2018-05-08 LAB — POCT GLYCOSYLATED HEMOGLOBIN (HGB A1C): HEMOGLOBIN A1C: 10.3 % — AB (ref 4.0–5.6)

## 2018-05-08 MED ORDER — FREESTYLE LIBRE 14 DAY SENSOR MISC: 1.0000 | 3 refills | Status: DC

## 2018-05-08 NOTE — Telephone Encounter (Signed)
-----   Message from Jessica PriestLinda D Spagnola, RN sent at 05/08/2018 12:51 PM EST ----- Regarding: Freestyle Libre Please order the 14 day libre sensor.  He does not need the reader. He will use his phone. Thank you

## 2018-05-08 NOTE — Telephone Encounter (Signed)
Ok, I have sent a prescription to the pharmacy

## 2018-05-08 NOTE — Progress Notes (Signed)
Subjective:    Patient ID: Scott Bush, male    DOB: [birth year], 22 y.o.   MRN: [id number]  HPI Pt returns for f/u of diabetes mellitus: DM type: 1 Dx'ed: 2007 Complications: none Therapy: insulin since dx DKA: only once, at dx Severe hypoglycemia: last episode was 2008 Pancreatitis: never Other: he takes multiple daily injections Interval history: no cbg record, but states cbg's are mildly low approx once per day. This usually happens at work, when he misses a meal, or is active at work. Pt says he sometimes misses the insulin approx twice per week  Past Medical History:  Diagnosis Date  . Diabetes mellitus without complication (HCC)     No past surgical history on file.  Social History   Socioeconomic History  . Marital status: Single    Spouse name: Not on file  . Number of children: Not on file  . Years of education: Not on file  . Highest education level: Not on file  Occupational History  . Not on file  Social Needs  . Financial resource strain: Not on file  . Food insecurity:    Worry: Not on file    Inability: Not on file  . Transportation needs:    Medical: Not on file    Non-medical: Not on file  Tobacco Use  . Smoking status: Never Smoker  . Smokeless tobacco: Never Used  Substance and Sexual Activity  . Alcohol use: No  . Drug use: Not on file  . Sexual activity: Not on file  Lifestyle  . Physical activity:    Days per week: Not on file    Minutes per session: Not on file  . Stress: Not on file  Relationships  . Social connections:    Talks on phone: Not on file    Gets together: Not on file    Attends religious service: Not on file    Active member of club or organization: Not on file    Attends meetings of clubs or organizations: Not on file    Relationship status: Not on file  . Intimate partner violence:    Fear of current or ex partner: Not on file    Emotionally abused: Not on file    Physically abused: Not on file    Forced sexual  activity: Not on file  Other Topics Concern  . Not on file  Social History Narrative  . Not on file    Current Outpatient Medications on File Prior to Visit  Medication Sig Dispense Refill  . glucagon (GLUCAGON EMERGENCY) 1 MG injection Inject 1 mg into the muscle once as needed. 1 each 12  . glucose blood (BAYER CONTOUR TEST) test strip 1 each by Other route 4 (four) times daily. And lancets 4/day 400 each 3  . insulin aspart (NOVOLOG FLEXPEN) 100 UNIT/ML FlexPen Inject 25 Units into the skin 3 (three) times daily with meals. and pen needles 4/day 30 mL 0  . Insulin Glargine (LANTUS SOLOSTAR) 100 UNIT/ML Solostar Pen Inject 15 Units into the skin at bedtime. 30 mL PRN   No current facility-administered medications on file prior to visit.     Allergies  Allergen Reactions  . Tomato Swelling  . Banana     GI upset   . Other     Blue cheese---Swelling     Family History  Problem Relation Age of Onset  . Diabetes type I Sister     BP 122/70 (BP Location: Right Arm, Patient Position: Sitting,  Cuff Size: Normal)   Pulse 67   Ht 5' 11.5" (1.816 m)   Wt 153 lb 12.8 oz (69.8 kg)   SpO2 95%   BMI 21.15 kg/m    Review of Systems He denies LOC    Objective:   Physical Exam VITAL SIGNS:  See vs page GENERAL: no distress Pulses: dorsalis pedis intact bilat.   MSK: no deformity of the feet CV: no leg edema Skin:  no ulcer on the feet.  normal color and temp on the feet. Neuro: sensation is intact to touch on the feet   Lab Results  Component Value Date   HGBA1C 10.3 (A) 05/08/2018       Assessment & Plan:  Type 1 DM: worse.  Hypoglycemia: frequent: he is a good candidate for continuous glucose monitor.   Patient Instructions  check your blood sugar 4 times a day: before the 3 meals, and at bedtime.  also check if you have symptoms of your blood sugar being too high or too low.  please keep a record of the readings and bring it to your next appointment here (or you  can bring the meter itself).  You can write it on any piece of paper.  please call us sooner if your blood sugar goes below 70, or if you have a lot of readings over 200.  Please continue the same insulins. Please see Bonita QuinLinda today, to see bout a V-GO pump, and continuous glucose monitor.   Please come back for a follow-up appointment in 2 months.

## 2018-05-08 NOTE — Patient Instructions (Addendum)
check your blood sugar 4 times a day: before the 3 meals, and at bedtime.  also check if you have symptoms of your blood sugar being too high or too low.  please keep a record of the readings and bring it to your next appointment here (or you can bring the meter itself).  You can write it on any piece of paper.  please call us sooner if your blood sugar goes below 70, or if you have a lot of readings over 200.  Please continue the same insulins. Please see Bonita QuinLinda today, to see bout a V-GO pump, and continuous glucose monitor.   Please come back for a follow-up appointment in 2 months.

## 2018-05-09 NOTE — Progress Notes (Signed)
Scott Bush was shown the V-go and he agreed that he might like to try this.  Paperwork was filled out and faxed to [employer] to determine the cost of this.  He will call after taking with them to let me know if he would like to try this using the 6 day  free trial kit.  He was also interested in the [place].  He does not like to test his blood sugar.  We discussed the difference between blood and sensor glucose, and he was shown how to put it on.  He is wanting this.  Note to Dr. Loanne Drilling to order this. He had no final questions.

## 2018-05-09 NOTE — Patient Instructions (Signed)
Call office if you would like to try the V-Go.

## 2018-05-22 DIAGNOSIS — R6889 Other general symptoms and signs: Secondary | ICD-10-CM | POA: Diagnosis not present

## 2018-05-22 DIAGNOSIS — E1065 Type 1 diabetes mellitus with hyperglycemia: Secondary | ICD-10-CM | POA: Diagnosis not present

## 2018-07-10 ENCOUNTER — Ambulatory Visit: Payer: BLUE CROSS/BLUE SHIELD | Admitting: Endocrinology

## 2018-07-10 ENCOUNTER — Ambulatory Visit (INDEPENDENT_AMBULATORY_CARE_PROVIDER_SITE_OTHER): Payer: BLUE CROSS/BLUE SHIELD | Admitting: Endocrinology

## 2018-07-10 ENCOUNTER — Encounter: Payer: Self-pay | Admitting: Endocrinology

## 2018-07-10 VITALS — BP 108/64 | HR 94 | Ht 71.5 in | Wt 154.6 lb

## 2018-07-10 DIAGNOSIS — E109 Type 1 diabetes mellitus without complications: Secondary | ICD-10-CM | POA: Diagnosis not present

## 2018-07-10 LAB — POCT GLYCOSYLATED HEMOGLOBIN (HGB A1C): HEMOGLOBIN A1C: 8.9 % — AB (ref 4.0–5.6)

## 2018-07-10 MED ORDER — INSULIN ASPART 100 UNIT/ML FLEXPEN: 28.0000 [IU] | PEN_INJECTOR | Freq: Three times a day (TID) | SUBCUTANEOUS | 11 refills | Status: DC

## 2018-07-10 MED ORDER — INSULIN GLARGINE 100 UNIT/ML SOLOSTAR PEN: 15.0000 [IU] | PEN_INJECTOR | Freq: Every day | SUBCUTANEOUS | 99 refills | Status: DC

## 2018-07-10 NOTE — Patient Instructions (Addendum)
check your blood sugar 4 times a day: before the 3 meals, and at bedtime.  also check if you have symptoms of your blood sugar being too high or too low.  please keep a record of the readings and bring it to your next appointment here (or you can bring the meter itself).  You can write it on any piece of paper.  please call us sooner if your blood sugar goes below 70, or if you have a lot of readings over 200.   Please change the insulins to the numbers listed below.   Please come back for a follow-up appointment in 2 months.   

## 2018-07-10 NOTE — Progress Notes (Signed)
Subjective:    Patient ID: Scott Bush, male    DOB: [birth year], 23 y.o.   MRN: [id number]  HPI Pt returns for f/u of diabetes mellitus: DM type: 1 Dx'ed: 2007 Complications: none Therapy: insulin since dx DKA: only once, at dx Severe hypoglycemia: last episode was 2008 Pancreatitis: never Other: he takes multiple daily injections; he works 7 PM-4 AM; he uses [employer] continuous glucose monitor.     Interval history: I reviewed continuous glucose monitor data on pt's phone.  Glucose varies from 45-390.  It is in general higher if he has recently eaten a meal.  He seldom misses then insulin doses.  Past Medical History:  Diagnosis Date  . Diabetes mellitus without complication (HCC)     No past surgical history on file.  Social History   Socioeconomic History  . Marital status: Single    Spouse name: Not on file  . Number of children: Not on file  . Years of education: Not on file  . Highest education level: Not on file  Occupational History  . Not on file  Social Needs  . Financial resource strain: Not on file  . Food insecurity:    Worry: Not on file    Inability: Not on file  . Transportation needs:    Medical: Not on file    Non-medical: Not on file  Tobacco Use  . Smoking status: Never Smoker  . Smokeless tobacco: Never Used  Substance and Sexual Activity  . Alcohol use: No  . Drug use: Not on file  . Sexual activity: Not on file  Lifestyle  . Physical activity:    Days per week: Not on file    Minutes per session: Not on file  . Stress: Not on file  Relationships  . Social connections:    Talks on phone: Not on file    Gets together: Not on file    Attends religious service: Not on file    Active member of club or organization: Not on file    Attends meetings of clubs or organizations: Not on file    Relationship status: Not on file  . Intimate partner violence:    Fear of current or ex partner: Not on file    Emotionally abused: Not on file      Physically abused: Not on file    Forced sexual activity: Not on file  Other Topics Concern  . Not on file  Social History Narrative  . Not on file    Current Outpatient Medications on File Prior to Visit  Medication Sig Dispense Refill  . Continuous Blood Gluc Sensor (FREESTYLE LIBRE 14 DAY SENSOR) MISC 1 Device by Does not apply route every 14 (fourteen) days. 6 each 3  . glucagon (GLUCAGON EMERGENCY) 1 MG injection Inject 1 mg into the muscle once as needed. 1 each 12   No current facility-administered medications on file prior to visit.     Allergies  Allergen Reactions  . Tomato Swelling  . Banana     GI upset   . Other     Blue cheese---Swelling     Family History  Problem Relation Age of Onset  . Diabetes type I Sister     BP 108/64 (BP Location: Right Arm, Patient Position: Sitting, Cuff Size: Normal)   Pulse 94   Ht 5' 11.5" (1.816 m)   Wt 154 lb 9.6 oz (70.1 kg)   SpO2 95%   BMI 21.26 kg/m   Review  of Systems Denies LOC    Objective:   Physical Exam VITAL SIGNS:  See vs page GENERAL: no distress Pulses: dorsalis pedis intact bilat.   MSK: no deformity of the feet CV: no leg edema Skin:  no ulcer on the feet.  normal color and temp on the feet. Neuro: sensation is intact to touch on the feet.   Lab Results  Component Value Date   HGBA1C 8.9 (A) 07/10/2018       Assessment & Plan:  Type 1 DM: The pattern of his cbg's indicates he needs some adjustment in his therapy.   Hypoglycemia: this limits aggressiveness of glycemic control.   Patient Instructions  check your blood sugar 4 times a day: before the 3 meals, and at bedtime.  also check if you have symptoms of your blood sugar being too high or too low.  please keep a record of the readings and bring it to your next appointment here (or you can bring the meter itself).  You can write it on any piece of paper.  please call us sooner if your blood sugar goes below 70, or if you have a lot of  readings over 200.  Please change the insulins to the numbers listed below.  Please come back for a follow-up appointment in 2 months.

## 2018-09-04 ENCOUNTER — Other Ambulatory Visit: Payer: Self-pay

## 2018-09-04 ENCOUNTER — Ambulatory Visit (INDEPENDENT_AMBULATORY_CARE_PROVIDER_SITE_OTHER): Payer: BLUE CROSS/BLUE SHIELD | Admitting: Endocrinology

## 2018-09-04 ENCOUNTER — Encounter: Payer: Self-pay | Admitting: Endocrinology

## 2018-09-04 VITALS — BP 110/60 | HR 63 | Temp 98.5°F | Ht 71.5 in | Wt 155.2 lb

## 2018-09-04 DIAGNOSIS — E109 Type 1 diabetes mellitus without complications: Secondary | ICD-10-CM | POA: Diagnosis not present

## 2018-09-04 LAB — POCT GLYCOSYLATED HEMOGLOBIN (HGB A1C): Hemoglobin A1C: 8.2 % — AB (ref 4.0–5.6)

## 2018-09-04 MED ORDER — INSULIN GLARGINE 100 UNIT/ML SOLOSTAR PEN: 20.0000 [IU] | PEN_INJECTOR | Freq: Every day | SUBCUTANEOUS | 99 refills | Status: DC

## 2018-09-04 MED ORDER — INSULIN ASPART 100 UNIT/ML FLEXPEN: PEN_INJECTOR | SUBCUTANEOUS | 11 refills | Status: DC

## 2018-09-04 NOTE — Progress Notes (Signed)
Subjective:    Patient ID: Scott Bush, male    DOB: 1995/07/19, 23 y.o.   MRN: [id number]  HPI Pt returns for f/u of diabetes mellitus: DM type: 1 Dx'ed: 2007 Complications: none Therapy: insulin since dx DKA: only once, at dx Severe hypoglycemia: last episode was 2008 Pancreatitis: never Other: he takes multiple daily injections; he works 7 PM-4 AM; he uses [employer] continuous glucose monitor.     Interval history: I reviewed continuous glucose monitor data on pt's phone.  Glucose varies from 50-380.  It is in general higher if he has not recently eaten a meal.  It is also highest at approx 6 PM.  He seldom misses then insulin doses.  He takes lantus 15/d, and novolog 1 unit/8 g CHO (averages approx 90 total units per day).   Past Medical History:  Diagnosis Date  . Diabetes mellitus without complication (HCC)     No past surgical history on file.  Social History   Socioeconomic History  . Marital status: Single    Spouse name: Not on file  . Number of children: Not on file  . Years of education: Not on file  . Highest education level: Not on file  Occupational History  . Not on file  Social Needs  . Financial resource strain: Not on file  . Food insecurity:    Worry: Not on file    Inability: Not on file  . Transportation needs:    Medical: Not on file    Non-medical: Not on file  Tobacco Use  . Smoking status: Never Smoker  . Smokeless tobacco: Never Used  Substance and Sexual Activity  . Alcohol use: No  . Drug use: Not on file  . Sexual activity: Not on file  Lifestyle  . Physical activity:    Days per week: Not on file    Minutes per session: Not on file  . Stress: Not on file  Relationships  . Social connections:    Talks on phone: Not on file    Gets together: Not on file    Attends religious service: Not on file    Active member of club or organization: Not on file    Attends meetings of clubs or organizations: Not on file    Relationship  status: Not on file  . Intimate partner violence:    Fear of current or ex partner: Not on file    Emotionally abused: Not on file    Physically abused: Not on file    Forced sexual activity: Not on file  Other Topics Concern  . Not on file  Social History Narrative  . Not on file    Current Outpatient Medications on File Prior to Visit  Medication Sig Dispense Refill  . Continuous Blood Gluc Sensor (FREESTYLE LIBRE 14 DAY SENSOR) MISC 1 Device by Does not apply route every 14 (fourteen) days. 6 each 3  . glucagon (GLUCAGON EMERGENCY) 1 MG injection Inject 1 mg into the muscle once as needed. 1 each 12   No current facility-administered medications on file prior to visit.     Allergies  Allergen Reactions  . Tomato Swelling  . Banana     GI upset   . Other     Blue cheese---Swelling     Family History  Problem Relation Age of Onset  . Diabetes type I Sister     BP 110/60 (BP Location: Right Arm, Patient Position: Sitting, Cuff Size: Normal)   Pulse 63  Temp 98.5 F (36.9 C) (Oral)   Ht 5' 11.5" (1.816 m)   Wt 155 lb 3.2 oz (70.4 kg)   SpO2 97%   BMI 21.34 kg/m    Review of Systems Denies LOC.      Objective:   Physical Exam VITAL SIGNS:  See vs page GENERAL: no distress Pulses: dorsalis pedis intact bilat.   MSK: no deformity of the feet CV: no leg edema Skin:  no ulcer on the feet.  normal color and temp on the feet. Neuro: sensation is intact to touch on the feet Ext: There is bilateral onychomycosis of the toenails.    Lab Results  Component Value Date   HGBA1C 8.2 (A) 09/04/2018        Assessment & Plan:  Type 1 DM: he needs increased rx.  Hypoglycemia: This limits aggressiveness of glycemic control.     Patient Instructions  check your blood sugar 4 times a day: before the 3 meals, and at bedtime.  also check if you have symptoms of your blood sugar being too high or too low.  please keep a record of the readings and bring it to your next  appointment here (or you can bring the meter itself).  You can write it on any piece of paper.  please call us sooner if your blood sugar goes below 70, or if you have a lot of readings over 200.  Please increase the lantus to 20 units per day, and: Please continue the same novolog.  Please come back for a follow-up appointment in 2 months.

## 2018-09-04 NOTE — Patient Instructions (Addendum)
check your blood sugar 4 times a day: before the 3 meals, and at bedtime.  also check if you have symptoms of your blood sugar being too high or too low.  please keep a record of the readings and bring it to your next appointment here (or you can bring the meter itself).  You can write it on any piece of paper.  please call us sooner if your blood sugar goes below 70, or if you have a lot of readings over 200.  Please increase the lantus to 20 units per day, and: Please continue the same novolog.  Please come back for a follow-up appointment in 2 months.

## 2018-09-26 ENCOUNTER — Other Ambulatory Visit: Payer: Self-pay

## 2018-09-26 ENCOUNTER — Telehealth: Payer: Self-pay | Admitting: Endocrinology

## 2018-09-26 MED ORDER — INSULIN ASPART 100 UNIT/ML FLEXPEN: PEN_INJECTOR | SUBCUTANEOUS | 1 refills | Status: DC

## 2018-09-26 MED ORDER — INSULIN GLARGINE 100 UNIT/ML SOLOSTAR PEN: 20.0000 [IU] | PEN_INJECTOR | Freq: Every day | SUBCUTANEOUS | 1 refills | Status: DC

## 2018-09-26 NOTE — Telephone Encounter (Signed)
Patient called to request his insulin be sent into the pharmacy due to the COVID-19 he has lost his insurance and would like to buy in bulk,   Please advise

## 2018-09-26 NOTE — Telephone Encounter (Signed)
90 day rx sent to pt's pharmacy with refill x1

## 2018-11-04 ENCOUNTER — Ambulatory Visit: Payer: BLUE CROSS/BLUE SHIELD | Admitting: Endocrinology

## 2018-12-03 DIAGNOSIS — Z20828 Contact with and (suspected) exposure to other viral communicable diseases: Secondary | ICD-10-CM | POA: Diagnosis not present

## 2019-01-01 ENCOUNTER — Ambulatory Visit: Payer: BLUE CROSS/BLUE SHIELD | Admitting: Endocrinology

## 2019-01-01 DIAGNOSIS — Z0289 Encounter for other administrative examinations: Secondary | ICD-10-CM

## 2019-01-20 DIAGNOSIS — R05 Cough: Secondary | ICD-10-CM | POA: Diagnosis not present

## 2019-01-20 DIAGNOSIS — Z20828 Contact with and (suspected) exposure to other viral communicable diseases: Secondary | ICD-10-CM | POA: Diagnosis not present

## 2019-01-20 DIAGNOSIS — R52 Pain, unspecified: Secondary | ICD-10-CM | POA: Diagnosis not present

## 2019-01-31 ENCOUNTER — Other Ambulatory Visit: Payer: Self-pay | Admitting: Endocrinology

## 2019-01-31 MED ORDER — FREESTYLE LIBRE 14 DAY SENSOR MISC: 1.0000 | 3 refills | Status: DC

## 2019-01-31 NOTE — Telephone Encounter (Signed)
Patient ph# [phone] called re: Freestyle Libre Monitor Sensor is broken (2 brand new sensors broke in 1 day). Patient requests a RX for the following;  MEDICATION: Continuous Blood Gluc Sensor (FREESTYLE LIBRE 14 DAY SENSOR) MISC  PHARMACY:   [id number] - [place], [place] [phone] (Phone) [phone] (Fax)    IS THIS A 90 DAY SUPPLY : Yes  IS PATIENT OUT OF MEDICATION: Yes  IF NOT; HOW MUCH IS LEFT: 0 LAST APPOINTMENT DATE: @7 /15/2020  NEXT APPOINTMENT DATE:@Visit  date not found  DO WE HAVE YOUR PERMISSION TO LEAVE A DETAILED MESSAGE:  Yes  OTHER COMMENTS:    **Let patient know to contact pharmacy at the end of the day to make sure medication is ready. **  ** Please notify patient to allow 48-72 hours to process**  **Encourage patient to contact the pharmacy for refills or they can request refills through [facility]**

## 2019-01-31 NOTE — Telephone Encounter (Signed)
RX sent

## 2019-02-14 ENCOUNTER — Telehealth: Payer: Self-pay

## 2019-02-14 NOTE — Telephone Encounter (Signed)
Received notification from Cover My Meds that PA for [facility] Reader has been approved.  Zyheir Hoe Key: [id number] - Rx #: [id number] Need help? Call us at [phone] Outcome Approvedtoday Effective from 02/14/2019 through 02/13/2020. Drug FreeStyle Libre 14 Day Reader device Form [employer] [place] [employer] officer, political party Form (CB) Original Claim Info [phone]

## 2019-02-14 NOTE — Telephone Encounter (Signed)
PA initiated today through Cover My Meds for Freestyle Reader. Will await insurance response re: approval/denial.  Qunicy Garabedian Key: [id number] - Rx #: [id number] Need help? Call us at [phone] Status Additional Information Required Drug FreeStyle Libre 14 Day Reader device Form Blue Cross [place] [employer] Electronic Request Form (CB) Original Claim Info [phone]

## 2019-06-05 ENCOUNTER — Telehealth: Payer: Self-pay

## 2019-06-05 NOTE — Telephone Encounter (Signed)
LOV 09/04/18. Per Dr. Loanne Drilling, pt will require an appt before a refill can be authorized. Please call to schedule

## 2019-06-05 NOTE — Telephone Encounter (Signed)
Patient is in the process of waiting on MD switch wanting to see if he can have refill until he knows if switch is approved

## 2019-06-05 NOTE — Telephone Encounter (Signed)
MEDICATION: insulin aspart (NOVOLOG FLEXPEN) 100 UNIT/ML FlexPen  PHARMACY:  [id number] - [place], [place] - [address]  IS THIS A 90 DAY SUPPLY :   IS PATIENT OUT OF MEDICATION:   IF NOT; HOW MUCH IS LEFT:   LAST APPOINTMENT DATE: @8 /28/2020  NEXT APPOINTMENT DATE:@Visit  date not found  DO WE HAVE YOUR PERMISSION TO LEAVE A DETAILED MESSAGE:  OTHER COMMENTS:    **Let patient know to contact pharmacy at the end of the day to make sure medication is ready. **  ** Please notify patient to allow 48-72 hours to process**  **Encourage patient to contact the pharmacy for refills or they can request refills through [facility]**

## 2019-06-05 NOTE — Telephone Encounter (Signed)
1.  Please schedule f/u appt 2.  Then please refill x 1, pending that appt. 3.  I don't know what MD "switch" means

## 2019-06-05 NOTE — Telephone Encounter (Signed)
Please advise 

## 2019-06-06 NOTE — Telephone Encounter (Signed)
Per Dr. Loanne Drilling, refill cannot be processed without an appt. Realizing pt is waiting until he "switches" Md's, this still is unsafe to refill a Rx without a current appt and labs. Please call to schedule.

## 2019-06-09 NOTE — Telephone Encounter (Signed)
Patient transferred care to Dr. Kelton Pillar

## 2019-06-11 ENCOUNTER — Other Ambulatory Visit: Payer: Self-pay

## 2019-06-11 DIAGNOSIS — E109 Type 1 diabetes mellitus without complications: Secondary | ICD-10-CM

## 2019-06-11 MED ORDER — NOVOLOG FLEXPEN 100 UNIT/ML ~~LOC~~ SOPN: PEN_INJECTOR | SUBCUTANEOUS | 0 refills | Status: DC

## 2019-06-17 ENCOUNTER — Ambulatory Visit: Payer: BC Managed Care – PPO | Admitting: Internal Medicine

## 2019-06-18 ENCOUNTER — Ambulatory Visit (INDEPENDENT_AMBULATORY_CARE_PROVIDER_SITE_OTHER): Payer: Self-pay | Admitting: Internal Medicine

## 2019-06-18 ENCOUNTER — Other Ambulatory Visit: Payer: Self-pay

## 2019-06-18 ENCOUNTER — Encounter: Payer: Self-pay | Admitting: Internal Medicine

## 2019-06-18 VITALS — BP 122/72 | HR 92 | Temp 98.2°F | Ht 72.0 in | Wt 153.2 lb

## 2019-06-18 DIAGNOSIS — E109 Type 1 diabetes mellitus without complications: Secondary | ICD-10-CM

## 2019-06-18 LAB — POCT GLYCOSYLATED HEMOGLOBIN (HGB A1C): Hemoglobin A1C: 8 % — AB (ref 4.0–5.6)

## 2019-06-18 MED ORDER — TRESIBA FLEXTOUCH 100 UNIT/ML ~~LOC~~ SOPN: 12.0000 [IU] | PEN_INJECTOR | Freq: Every day | SUBCUTANEOUS | 3 refills | Status: DC

## 2019-06-18 MED ORDER — LANTUS SOLOSTAR 100 UNIT/ML ~~LOC~~ SOPN: 12.0000 [IU] | PEN_INJECTOR | Freq: Every day | SUBCUTANEOUS | 3 refills | Status: DC

## 2019-06-18 MED ORDER — INSULIN PEN NEEDLE 32G X 4 MM MISC: 1.0000 | Freq: Four times a day (QID) | 3 refills | Status: DC

## 2019-06-18 MED ORDER — NOVOLOG FLEXPEN 100 UNIT/ML ~~LOC~~ SOPN: PEN_INJECTOR | SUBCUTANEOUS | 3 refills | Status: DC

## 2019-06-18 NOTE — Progress Notes (Addendum)
Name: Scott Bush  Age/ Sex: 23 y.o., male   MRN/ DOB: [id number], 03/04/1996     PCP: Patient, No Pcp Per   Reason for Endocrinology Evaluation: Type 1 Diabetes Mellitus  Initial Endocrine Consultative Visit: 07/11/2016    PATIENT IDENTIFIER: Scott Bush is a 23 y.o. male with a past medical history of T1DM. The patient has followed with Endocrinology clinic since 07/11/2016 for consultative assistance with management of his diabetes.  DIABETIC HISTORY:  Scott Bush was diagnosed with T1DM in 2007,has hx of severe hypoglycemia in 2008. DKA at the time of diagnosis only. His hemoglobin A1c has ranged from 8.2% in 2020, peaking at 10.3% in 2019.  Sister with T1DM.  SUBJECTIVE:   During the last visit (09/04/18): A1c 8.2%. Continued MDI regimen   Today (06/18/2019): Scott Bush is here to establish care with me. He checks his blood sugars multiple times a day through the freestyle libre. The patient has had hypoglycemic episodes since the last clinic visit, which typically occur 3 x /week - most often occuring with no pattern. The patient is symptomatic with these episodes. Otherwise, the patient has not required any recent emergency interventions for hypoglycemia and has not had recent hospitalizations secondary to hyper or hypoglycemic episodes.   Eats 2 meals a day, snacks once a day   Works 9 AM - 8 PM supervisor  Lives with a friend   ROS: As per HPI and as detailed below: Review of Systems  Constitutional: Negative for chills and fever.  Respiratory: Negative for cough and shortness of breath.   Cardiovascular: Negative for chest pain and palpitations.  Gastrointestinal: Negative for diarrhea and nausea.      HOME DIABETES REGIMEN:  Lantus 20 units daily - taking 14-16 units daily  Novolog I:C ratio of 1:8        CONTINUOUS GLUCOSE MONITORING RECORD INTERPRETATION    Dates of Recording: 12/17-12/30/20   Sensor description: [employer]  Results statistics:   CGM  use % of time 93  Average and SD 201  Time in range      37  %  % Time Above 180 32  % Time above 250 27  % Time Below target 2     Glycemic patterns summary: hyperglycemia through the day  Hyperglycemic episodes  Through the day   Hypoglycemic episodes occurred early morning and sometimes during the day  Overnight periods: trends down   Preprandial periods: high     DIABETIC COMPLICATIONS: Microvascular complications:    Denies:  Neuropathy, retinopathy,   Last Eye Exam: Completed years ago   Macrovascular complications:    Denies: CAD, CVA, PVD   HISTORY:  Past Medical History:  Past Medical History:  Diagnosis Date  . Diabetes mellitus without complication [facility])     Past Surgical History: No past surgical history on file.  Social History:  reports that he has never smoked. He has never used smokeless tobacco. He reports that he does not drink alcohol. No history on file for drug. Family History:  Family History  Problem Relation Age of Onset  . Diabetes type I Sister      HOME MEDICATIONS: Allergies as of 06/18/2019      Reactions   Tomato Swelling   Banana    GI upset    Other    Blue cheese---Swelling       Medication List       Accurate as of June 18, 2019 11:43 AM. If you have  any questions, ask your nurse or doctor.        FreeStyle Libre 14 Day Sensor Misc 1 Device by Does not apply route every 14 (fourteen) days.   glucagon 1 MG injection Inject 1 mg into the muscle once as needed.   Insulin Pen Needle 32G X 4 MM Misc 1 Device by Does not apply route 4 (four) times daily. Started by: Scarlette Shorts, MD   Lantus SoloStar 100 UNIT/ML Solostar Pen Generic drug: Insulin Glargine Inject 12 Units into the skin at bedtime. What changed: how much to take Changed by: Scarlette Shorts, MD   NovoLOG FlexPen 100 UNIT/ML FlexPen Generic drug: insulin aspart Max daily 90 u/day What changed: additional  instructions Changed by: Scarlette Shorts, MD        OBJECTIVE:   Vital Signs: BP 122/72 (BP Location: Left Arm, Patient Position: Sitting, Cuff Size: Normal)   Pulse 92   Temp 98.2 F (36.8 C)   Ht 6' (1.829 m)   Wt 153 lb 3.2 oz (69.5 kg)   SpO2 98%   BMI 20.78 kg/m   Wt Readings from Last 3 Encounters:  06/18/19 153 lb 3.2 oz (69.5 kg)  09/04/18 155 lb 3.2 oz (70.4 kg)  07/10/18 154 lb 9.6 oz (70.1 kg)     Exam: General: Pt appears well and is in NAD  Neck: General: Supple without adenopathy. Thyroid: Thyroid size normal.  No goiter or nodules appreciated. No thyroid bruit.  Lungs: Clear with good BS bilat with no rales, rhonchi, or wheezes  Heart: RRR with normal S1 and S2 and no gallops; no murmurs; no rub  Abdomen: Normoactive bowel sounds, soft, nontender, without masses or organomegaly palpable  Extremities: No pretibial edema. No tremor. Normal strength and motion throughout. See detailed diabetic foot exam below.  Skin: Normal texture and temperature to palpation. No rash noted. No Acanthosis nigricans/skin tags. No lipohypertrophy.  Neuro: MS is good with appropriate affect, pt is alert and Ox3    DM foot exam:  06/18/2019  The skin of the feet is intact without sores or ulcerations. The pedal pulses are 2+ on right and 2+ on left. The sensation is intact to a screening 5.07, 10 gram monofilament bilaterally        DATA REVIEWED:  Lab Results  Component Value Date   HGBA1C 8.0 (A) 06/18/2019   HGBA1C 8.2 (A) 09/04/2018   HGBA1C 8.9 (A) 07/10/2018   Lab Results  Component Value Date   CREATININE 0.96 01/21/2015    ASSESSMENT / PLAN / RECOMMENDATIONS:   1) Type 1 Diabetes Mellitus, Poorly controlled, With out complications - Most recent A1c of 8.0 %. Goal A1c < 7.0 %.      - I have discussed with the patient the pathophysiology of diabetes. We went over the natural progression of the disease. We talked about both insulin resistance and  insulin deficiency. We stressed the importance of lifestyle changes including diet and exercise. I explained the complications associated with diabetes including retinopathy, nephropathy, neuropathy as well as increased risk of cardiovascular disease. We went over the benefit seen with glycemic control.  I explained to the patient that diabetic patients are at higher than normal risk for amputations.  - Discussed pharmacokinetics of basal/bolus insulin and the importance of taking prandial insulin with meals.  We also discussed avoiding sugar-sweetened beverages and snacks, when possible.   - Pt interested in Dexcom , he will contact his insurance to verify coverage since he  already has a freestyle libre, but doesn't;t like it.  - Will obtain labs on next visit  - Pt urged to establish care with a PCP   MEDICATIONS: - Decrease Lantus to 12 units daily (will change to tresiba base on insurance) - Novolog: Continue 1 unit for every 8 grams of carbohydrates - Correction Factor: add 2 units of Novolog to mealtime,  if  pre-meal BG is 200 or higher.    EDUCATION / INSTRUCTIONS:  BG monitoring instructions: Patient is instructed to check his blood sugars 4 times a day, before each meal and bedtime .  Call [facility] if: BG persistently < 70 or > 300. . I reviewed the Rule of 15 for the treatment of hypoglycemia in detail with the patient. Literature supplied.   2) Diabetic complications:   Eye: Does not have known diabetic retinopathy. He was urged to have a an eye exam soon.   Neuro/ Feet: Does not have known diabetic peripheral neuropathy .  Renal: Patient does not have known baseline CKD.  3) Lipids: No indications for statins at this time    Twenty five minutes was spent with the patient, > 50% was spent in counseling and education   F/U in 3 months    Signed electronically by: Lyndle HerrlichAbby Jaralla Carsten Carstarphen, MD  [facility]  [facility] [address] [place], [place] [address] Phone: [phone] FAX: [phone]   CC: Patient, No Pcp Per No address on file Phone: None  Fax: None  Return to Endocrinology clinic as below: Future Appointments  Date Time Provider Department Center  09/23/2019  1:20 PM Brock Larmon, Konrad DoloresIbtehal Jaralla, MD [facility] None

## 2019-06-18 NOTE — Addendum Note (Signed)
Addended by: Dorita Sciara on: 06/18/2019 02:13 PM   Modules accepted: Orders

## 2019-06-18 NOTE — Patient Instructions (Signed)
-   Decrease Lantus to 12 units daily  - Novolog: Continue 1 unit for every 8 grams of carbohydrates - Correction Factor: add 2 units of Novolog to mealtime insulin if your pre-meal BG is 200 or higher.    - Check with your insurance about Dexcom and let us know if they cover.   - Check sugar before each meal and bedtime     - HOW TO TREAT LOW BLOOD SUGARS (Blood sugar LESS THAN 70 MG/DL)  Please follow the RULE OF 15 for the treatment of hypoglycemia treatment (when your (blood sugars are less than 70 mg/dL)    STEP 1: Take 15 grams of carbohydrates when your blood sugar is low, which includes:   3-4 GLUCOSE TABS  OR  3-4 OZ OF JUICE OR REGULAR SODA OR  ONE TUBE OF GLUCOSE GEL     STEP 2: RECHECK blood sugar in 15 MINUTES STEP 3: If your blood sugar is still low at the 15 minute recheck --> then, go back to STEP 1 and treat AGAIN with another 15 grams of carbohydrates.

## 2019-07-04 ENCOUNTER — Telehealth: Payer: Self-pay | Admitting: Internal Medicine

## 2019-07-04 MED ORDER — DEXCOM G6 RECEIVER DEVI: 1.0000 | 0 refills | Status: AC

## 2019-07-04 MED ORDER — DEXCOM G6 TRANSMITTER MISC: 1.0000 | 4 refills | Status: DC

## 2019-07-04 MED ORDER — DEXCOM G6 SENSOR MISC: 1.0000 | 6 refills | Status: DC

## 2019-07-04 NOTE — Telephone Encounter (Signed)
Spoke to pt and informed him also according to pt chart and last OV note he was changed to [place] if Lantus would not be covered. Verified dosages w/pt

## 2019-07-04 NOTE — Telephone Encounter (Signed)
Please advise 

## 2019-07-04 NOTE — Telephone Encounter (Signed)
Patient called to advise that his insurance is changing what they are covering. Per patient his Free Style Josephine Igo needs to be changed to Dexcom.  He needs this called in ASAP - his current Free Style is running out in 2 hours.  Also states that his insulins will have to be changed as well but does't know specifically yet.    [facility] [address]  Patient back number is [phone]

## 2019-07-07 DIAGNOSIS — E119 Type 2 diabetes mellitus without complications: Secondary | ICD-10-CM | POA: Diagnosis not present

## 2019-09-04 ENCOUNTER — Telehealth: Payer: Self-pay | Admitting: Internal Medicine

## 2019-09-04 ENCOUNTER — Other Ambulatory Visit: Payer: Self-pay | Admitting: Internal Medicine

## 2019-09-04 MED ORDER — DEXCOM G6 SENSOR MISC: 1.0000 | 3 refills | Status: DC

## 2019-09-04 MED ORDER — DEXCOM G6 TRANSMITTER MISC: 1.0000 | 3 refills | Status: DC

## 2019-09-04 NOTE — Telephone Encounter (Signed)
MEDICATION: dexcom receiver and sensor  PHARMACY:   [id number] - [place], [place] - [address] Phone:  [phone]  Fax:  [phone]     IS THIS A 90 DAY SUPPLY : not sure  IS PATIENT OUT OF MEDICATION: yes  IF NOT; HOW MUCH IS LEFT:   LAST APPOINTMENT DATE: 06/18/19  NEXT APPOINTMENT DATE: 09/23/2019  DO WE HAVE YOUR PERMISSION TO LEAVE A DETAILED MESSAGE: yes  OTHER COMMENTS:    **Let patient know to contact pharmacy at the end of the day to make sure medication is ready. **  ** Please notify patient to allow 48-72 hours to process**  **Encourage patient to contact the pharmacy for refills or they can request refills through [facility]**

## 2019-09-23 ENCOUNTER — Other Ambulatory Visit: Payer: Self-pay

## 2019-09-23 ENCOUNTER — Ambulatory Visit (INDEPENDENT_AMBULATORY_CARE_PROVIDER_SITE_OTHER): Payer: BC Managed Care – PPO | Admitting: Internal Medicine

## 2019-09-23 VITALS — BP 116/72 | HR 74 | Temp 98.1°F | Ht 72.0 in | Wt 154.8 lb

## 2019-09-23 DIAGNOSIS — E109 Type 1 diabetes mellitus without complications: Secondary | ICD-10-CM | POA: Diagnosis not present

## 2019-09-23 LAB — COMPREHENSIVE METABOLIC PANEL
ALT: 26 U/L (ref 0–53)
AST: 32 U/L (ref 0–37)
Albumin: 4.2 g/dL (ref 3.5–5.2)
Alkaline Phosphatase: 95 U/L (ref 39–117)
BUN: 16 mg/dL (ref 6–23)
CO2: 29 mEq/L (ref 19–32)
Calcium: 9.4 mg/dL (ref 8.4–10.5)
Chloride: 104 mEq/L (ref 96–112)
Creatinine, Ser: 0.87 mg/dL (ref 0.40–1.50)
GFR: 128.48 mL/min (ref >60.00)
Glucose, Bld: 90 mg/dL (ref 70–99)
Potassium: 4.1 mEq/L (ref 3.5–5.1)
Sodium: 138 mEq/L (ref 135–145)
Total Bilirubin: 0.5 mg/dL (ref 0.2–1.2)
Total Protein: 7.2 g/dL (ref 6.0–8.3)

## 2019-09-23 LAB — MICROALBUMIN / CREATININE URINE RATIO
Creatinine,U: 265.6 mg/dL
Microalb Creat Ratio: 0.6 mg/g (ref 0.0–30.0)
Microalb, Ur: 1.6 mg/dL (ref 0.0–1.9)

## 2019-09-23 NOTE — Patient Instructions (Signed)
-   Decrease Tresiba to 10 units daily  - Novolog: Continue 1 unit for every 6 grams of carbohydrates - Novolog correctional insulin: ADD extra units on insulin to your meal-time Novolog dose if your blood sugars are higher than 180. Use the scale below to help guide you:   Blood sugar before meal Number of units to inject  Less than 180 0 unit  181 -  230 1 units  231-  280 2 units  281 -  330 3 units  331-  380 4 units  381 -  430 5 units  431-  480 6 units    - Check sugar before each meal and bedtime     - HOW TO TREAT LOW BLOOD SUGARS (Blood sugar LESS THAN 70 MG/DL)  Please follow the RULE OF 15 for the treatment of hypoglycemia treatment (when your (blood sugars are less than 70 mg/dL)    STEP 1: Take 15 grams of carbohydrates when your blood sugar is low, which includes:   3-4 GLUCOSE TABS  OR  3-4 OZ OF JUICE OR REGULAR SODA OR  ONE TUBE OF GLUCOSE GEL     STEP 2: RECHECK blood sugar in 15 MINUTES STEP 3: If your blood sugar is still low at the 15 minute recheck --> then, go back to STEP 1 and treat AGAIN with another 15 grams of carbohydrates.

## 2019-09-23 NOTE — Progress Notes (Signed)
Name: Scott Bush  Age/ Sex: 24 y.o., male   MRN/ DOB: [id number], [birth year]     PCP: Patient, No Pcp Per   Reason for Endocrinology Evaluation: Type 1 Diabetes Mellitus  Initial Endocrine Consultative Visit: 07/11/2016    PATIENT IDENTIFIER: Scott Bush is a 24 y.o. male with a past medical history of T1DM. The patient has followed with Endocrinology clinic since 07/11/2016 for consultative assistance with management of his diabetes.  DIABETIC HISTORY:  Scott Bush was diagnosed with T1DM in 2007,has hx of severe hypoglycemia in 2008. DKA at the time of diagnosis only. His hemoglobin A1c has ranged from 8.2% in 2020, peaking at 10.3% in 2019.  Sister with T1DM.  SUBJECTIVE:   During the last visit (06/18/19): A1c 8.2%. Adjusted  MDI regimen   Today (09/24/2019): Scott Bush is here to establish care with me. He checks his blood sugars multiple times a day through the freestyle libre. The patient has had hypoglycemic episodes since the last clinic visit, which typically occur 1 x /week - most often occuring with no pattern. The patient is symptomatic with these episodes. Otherwise, the patient has not required any recent emergency interventions for hypoglycemia and has not had recent hospitalizations secondary to hyper or hypoglycemic episodes.   Eats 2 meals a day, snacks once a day   Works 9 AM - 5 PM supervisor  Lives with a friend    Tends to take 3 units less of novolog prior to going to work     ROS: As per HPI and as detailed below: Review of Systems  Constitutional: Negative for chills and fever.  Respiratory: Negative for cough and shortness of breath.   Cardiovascular: Negative for chest pain and palpitations.  Gastrointestinal: Negative for diarrhea and nausea.      HOME DIABETES REGIMEN:  Tresiba  12 units daily Novolog I:C ratio of 1:8      CONTINUOUS GLUCOSE MONITORING RECORD INTERPRETATION    Dates of Recording: 3/24-09/23/2019   Sensor description: Dexcom   Results statistics:   CGM use % of time 100  Average and SD 165  Time in range   54%  % Time Above 180 26  % Time above 250 13  % Time Below target 5     Glycemic patterns summary: hyperglycemia through the day  Hyperglycemic episodes  Through the day   Hypoglycemic episodes occurred early morning and sometimes during the day  Overnight periods: trends down   Preprandial periods: variable     DIABETIC COMPLICATIONS: Microvascular complications:    Denies:  Neuropathy, retinopathy,   Last Eye Exam: Completed years ago   Macrovascular complications:    Denies: CAD, CVA, PVD   HISTORY:  Past Medical History:  Past Medical History:  Diagnosis Date  . Diabetes mellitus without complication [facility])     Past Surgical History: No past surgical history on file.  Social History:  reports that he has never smoked. He has never used smokeless tobacco. He reports that he does not drink alcohol. No history on file for drug. Family History:  Family History  Problem Relation Age of Onset  . Diabetes type I Sister      HOME MEDICATIONS: Allergies as of 09/23/2019      Reactions   Tomato Swelling   Banana    GI upset    Other    Blue cheese---Swelling       Medication List       Accurate as of September 23, 2019 11:59 PM. If you have any questions, ask your nurse or doctor.        Dexcom G6 Receiver Devi 1 Device by Does not apply route as directed.   Dexcom G6 Sensor Misc 1 Device by Does not apply route as directed.   Dexcom G6 Transmitter Misc 1 Device by Does not apply route as directed.   glucagon 1 MG injection Inject 1 mg into the muscle once as needed.   Insulin Pen Needle 32G X 4 MM Misc 1 Device by Does not apply route 4 (four) times daily.   NovoLOG FlexPen 100 UNIT/ML FlexPen Generic drug: insulin aspart Max daily 90 u/day   Tresiba FlexTouch 100 UNIT/ML FlexTouch Pen Generic drug: insulin degludec Inject 0.12 mLs (12 Units total) into the  skin daily.        OBJECTIVE:   Vital Signs: BP 116/72 (BP Location: Right Arm, Patient Position: Sitting, Cuff Size: Large)   Pulse 74   Temp 98.1 F (36.7 C)   Ht 6' (1.829 m)   Wt 154 lb 12.8 oz (70.2 kg)   SpO2 97%   BMI 20.99 kg/m   Wt Readings from Last 3 Encounters:  09/23/19 154 lb 12.8 oz (70.2 kg)  06/18/19 153 lb 3.2 oz (69.5 kg)  09/04/18 155 lb 3.2 oz (70.4 kg)     Exam: General: Pt appears well and is in NAD  Neck: General: Supple without adenopathy. Thyroid: Thyroid size normal.  No goiter or nodules appreciated. No thyroid bruit.  Lungs: Clear with good BS bilat with no rales, rhonchi, or wheezes  Heart: RRR with normal S1 and S2 and no gallops; no murmurs; no rub  Abdomen: Normoactive bowel sounds, soft, nontender, without masses or organomegaly palpable  Extremities: No pretibial edema. No tremor. Normal strength and motion throughout. See detailed diabetic foot exam below.  Skin: Normal texture and temperature to palpation. No rash noted. No Acanthosis nigricans/skin tags. No lipohypertrophy.  Neuro: MS is good with appropriate affect, pt is alert and Ox3    DM foot exam:  09/23/2019  The skin of the feet is intact without sores or ulcerations. The pedal pulses are 2+ on right and 2+ on left. The sensation is intact to a screening 5.07, 10 gram monofilament bilaterally        DATA REVIEWED:  Lab Results  Component Value Date   HGBA1C 8.0 (A) 06/18/2019   HGBA1C 8.2 (A) 09/04/2018   HGBA1C 8.9 (A) 07/10/2018   Lab Results  Component Value Date   MICROALBUR 1.6 09/23/2019   CREATININE 0.87 09/23/2019    ASSESSMENT / PLAN / RECOMMENDATIONS:   1) Type 1 Diabetes Mellitus, Poorly controlled, With out complications - Most recent A1c of 8.0 %. Goal A1c < 7.0 %.      - I have discussed with the patient the pathophysiology of diabetes. We went over the natural progression of the disease. We talked about both insulin resistance and insulin  deficiency. We stressed the importance of lifestyle changes including diet and exercise. I explained the complications associated with diabetes including retinopathy, nephropathy, neuropathy as well as increased risk of cardiovascular disease. We went over the benefit seen with glycemic control.  I explained to the patient that diabetic patients are at higher than normal risk for amputations.  - Discussed pharmacokinetics of basal/bolus insulin and the importance of taking prandial insulin with meals.  We also discussed avoiding sugar-sweetened beverages and snacks, when possible.   - Pt interested in Dexcom ,  he will contact his insurance to verify coverage since he already has a freestyle libre, but doesn't;t like it.  - Will obtain labs on next visit  - Pt urged to establish care with a PCP   MEDICATIONS: - Decrease Tresiba to 10 units daily  - Novolog: Continue 1 unit for every 6 grams of carbohydrates - Correction Factor: Novolog (BG-130/50)   EDUCATION / INSTRUCTIONS:  BG monitoring instructions: Patient is instructed to check his blood sugars 4 times a day, before each meal and bedtime .  Call [facility] if: BG persistently < 70 or > 300. . I reviewed the Rule of 15 for the treatment of hypoglycemia in detail with the patient. Literature supplied.   2) Diabetic complications:   Eye: Does not have known diabetic retinopathy. He was urged to have a an eye exam soon.   Neuro/ Feet: Does not have known diabetic peripheral neuropathy .   Renal: Patient does not have known baseline CKD.  3) Lipids: No indications for statins at this time     F/U in 3 months    Signed electronically by: Lyndle Herrlich, MD  [facility]  [facility] [address] [place] [place], [place] [address] Phone: [phone] FAX: [phone]   CC: Patient, No Pcp Per No address on file Phone: None  Fax: None  Return to Endocrinology  clinic as below: Future Appointments  Date Time Provider Department Center  12/26/2019  2:00 PM Fernanda Twaddell, Konrad Dolores, MD [facility] None

## 2019-09-24 ENCOUNTER — Encounter: Payer: Self-pay | Admitting: Internal Medicine

## 2019-09-24 LAB — POCT GLYCOSYLATED HEMOGLOBIN (HGB A1C): Hemoglobin A1C: 7.9 % — AB (ref 4.0–5.6)

## 2019-10-14 ENCOUNTER — Ambulatory Visit (HOSPITAL_COMMUNITY)
Admission: EM | Admit: 2019-10-14 | Discharge: 2019-10-14 | Disposition: A | Discharge status: Home / Self Care | Payer: BC Managed Care – PPO | Attending: Family Medicine | Admitting: Family Medicine

## 2019-10-14 ENCOUNTER — Ambulatory Visit (INDEPENDENT_AMBULATORY_CARE_PROVIDER_SITE_OTHER): Payer: BC Managed Care – PPO

## 2019-10-14 ENCOUNTER — Other Ambulatory Visit: Payer: Self-pay

## 2019-10-14 ENCOUNTER — Encounter (HOSPITAL_COMMUNITY): Payer: Self-pay

## 2019-10-14 DIAGNOSIS — R591 Generalized enlarged lymph nodes: Secondary | ICD-10-CM

## 2019-10-14 DIAGNOSIS — R2231 Localized swelling, mass and lump, right upper limb: Secondary | ICD-10-CM | POA: Diagnosis not present

## 2019-10-14 NOTE — Discharge Instructions (Signed)
This is most likely lymph node swelling.  Could be reaction from the vaccine. X-ray did not show any acute abnormalities This should resolve on its own with time.  If not please follow-up especially if you start developing other symptoms to include fevers, chills, pain

## 2019-10-14 NOTE — ED Triage Notes (Signed)
Patient reports he noticed a lump on his chest yesterday. Denies pain.

## 2019-10-15 NOTE — ED Provider Notes (Signed)
[facility]    CSN: [id number] Arrival date & time: 10/14/19  1129      History   Chief Complaint Chief Complaint  Patient presents with  . Mass    HPI Scott Bush is a 24 y.o. male.   Patient is a 24 year old male with past medical history of diabetes.  He presents today with a mass, swelling to right chest area under the clavicle.  He noticed this yesterday.  Denies any pain from the area, erythema.  Denies any fever, chills, body aches, joint pain.  Did receive his second Covid vaccine 1 week ago in the right arm.  No injuries to the chest, heavy lifting.  No chest pain or shortness of breath.  Has not taken anything for his symptoms.  ROS per HPI      Past Medical History:  Diagnosis Date  . Diabetes mellitus without complication [facility])     Patient Active Problem List   Diagnosis Date Noted  . Diabetes (HCC) 07/13/2016    History reviewed. No pertinent surgical history.     Home Medications    Prior to Admission medications   Medication Sig Start Date End Date Taking? Authorizing Provider  Continuous Blood Gluc Receiver (DEXCOM G6 RECEIVER) DEVI 1 Device by Does not apply route as directed. 07/04/19   Shamleffer, Konrad Dolores, MD  Continuous Blood Gluc Sensor (DEXCOM G6 SENSOR) MISC 1 Device by Does not apply route as directed. 09/04/19   Shamleffer, Konrad Dolores, MD  Continuous Blood Gluc Transmit (DEXCOM G6 TRANSMITTER) MISC 1 Device by Does not apply route as directed. 09/04/19   Shamleffer, Konrad Dolores, MD  glucagon (GLUCAGON EMERGENCY) 1 MG injection Inject 1 mg into the muscle once as needed. 07/11/16   Romero Belling, MD  insulin aspart (NOVOLOG FLEXPEN) 100 UNIT/ML FlexPen Max daily 90 u/day 06/18/19   Shamleffer, Konrad Dolores, MD  insulin degludec (TRESIBA FLEXTOUCH) 100 UNIT/ML SOPN FlexTouch Pen Inject 0.12 mLs (12 Units total) into the skin daily. 06/18/19   Shamleffer, Konrad Dolores, MD  Insulin Pen Needle 32G X 4 MM MISC 1  Device by Does not apply route 4 (four) times daily. 06/18/19   Shamleffer, Konrad Dolores, MD    Family History Family History  Problem Relation Age of Onset  . Diabetes type I Sister     Social History Social History   Tobacco Use  . Smoking status: Former Games developer  . Smokeless tobacco: Never Used  Substance Use Topics  . Alcohol use: Yes    Comment: occasionally  . Drug use: Not on file     Allergies   Tomato, Banana, and Other   Review of Systems Review of Systems   Physical Exam Triage Vital Signs ED Triage Vitals  Enc Vitals Group     BP 10/14/19 1157 112/67     Pulse Rate 10/14/19 1157 (!) 51     Resp 10/14/19 1157 13     Temp 10/14/19 1157 98.1 F (36.7 C)     Temp Source 10/14/19 1157 Oral     SpO2 10/14/19 1157 99 %     Weight --      Height --      Head Circumference --      Peak Flow --      Pain Score 10/14/19 1156 0     Pain Loc --      Pain Edu? --      Excl. in GC? --    No data found.  Updated  Vital Signs BP 112/67 (BP Location: Right Arm)   Pulse (!) 51   Temp 98.1 F (36.7 C) (Oral)   Resp 13   SpO2 99%   Visual Acuity Right Eye Distance:   Left Eye Distance:   Bilateral Distance:    Right Eye Near:   Left Eye Near:    Bilateral Near:     Physical Exam Vitals and nursing note reviewed.  Constitutional:      Appearance: Normal appearance.  HENT:     Head: Normocephalic and atraumatic.     Nose: Nose normal.  Eyes:     Conjunctiva/sclera: Conjunctivae normal.  Pulmonary:     Effort: Pulmonary effort is normal.  Chest:     Chest wall: Mass and swelling present. No tenderness.       Comments: Soft mass under clavicle   No redness, drainage.  Non tender.   Musculoskeletal:        General: Normal range of motion.     Cervical back: Normal range of motion.  Skin:    General: Skin is warm and dry.  Neurological:     Mental Status: He is alert.  Psychiatric:        Mood and Affect: Mood normal.      UC  Treatments / Results  Labs (all labs ordered are listed, but only abnormal results are displayed) Labs Reviewed - No data to display  EKG   Radiology DG Clavicle Right  Result Date: 10/14/2019 CLINICAL DATA:  Mass under right clavicle.  No injury EXAM: RIGHT CLAVICLE - 2+ VIEWS COMPARISON:  None. FINDINGS: There is no evidence of fracture or other focal bone lesions. Soft tissues are unremarkable. IMPRESSION: Negative. Electronically Signed   By: Franchot Gallo M.D.   On: 10/14/2019 13:03    Procedures Procedures (including critical care time)  Medications Ordered in UC Medications - No data to display  Initial Impression / Assessment and Plan / UC Course  I have reviewed the triage vital signs and the nursing notes.  Pertinent labs & imaging results that were available during my care of the patient were reviewed by me and considered in my medical decision making (see chart for details).     Lymphadenopathy Most likely reaction from Covid vaccine We will have him rest, monitor the area and if he starts developing any other symptoms he will need to follow-up X-ray negative for any acute abnormalities.  Final Clinical Impressions(s) / UC Diagnoses   Final diagnoses:  Lymphadenopathy     Discharge Instructions     This is most likely lymph node swelling.  Could be reaction from the vaccine. X-ray did not show any acute abnormalities This should resolve on its own with time.  If not please follow-up especially if you start developing other symptoms to include fevers, chills, pain    ED Prescriptions    None     PDMP not reviewed this encounter.   Loura Halt A, NP 10/15/19 1055

## 2019-12-26 ENCOUNTER — Ambulatory Visit: Payer: BC Managed Care – PPO | Admitting: Internal Medicine

## 2019-12-26 NOTE — Progress Notes (Deleted)
Name: Scott Bush  Age/ Sex: 24 y.o., male   MRN/ DOB: [id number], 1995/07/21     PCP: Patient, No Pcp Per   Reason for Endocrinology Evaluation: Type 1 Diabetes Mellitus  Initial Endocrine Consultative Visit: 07/11/2016    PATIENT IDENTIFIER: [name] is a 24 y.o. male with a past medical history of T1DM. The patient has followed with Endocrinology clinic since 07/11/2016 for consultative assistance with management of his diabetes.  DIABETIC HISTORY:  Scott Bush was diagnosed with T1DM in 2007,has hx of severe hypoglycemia in 2008. DKA at the time of diagnosis only. His hemoglobin A1c has ranged from 8.2% in 2020, peaking at 10.3% in 2019.  Sister with T1DM.  SUBJECTIVE:   During the last visit (09/23/2019): A1c 8.0%. Adjusted  MDI regimen   Today (12/26/2019): Scott Bush is here to establish care with me. He checks his blood sugars multiple times a day through the freestyle libre. The patient has had hypoglycemic episodes since the last clinic visit, which typically occur 1 x /week - most often occuring with no pattern. The patient is symptomatic with these episodes.     HOME DIABETES REGIMEN:  Tresiba  10 units daily Novolog I:C ratio of 1: 6  CF: Novolog (BG-130/50)    CONTINUOUS GLUCOSE MONITORING RECORD INTERPRETATION    Dates of Recording: 3/24-09/23/2019   Sensor description: Dexcom  Results statistics:   CGM use % of time 100  Average and SD 165  Time in range   54%  % Time Above 180 26  % Time above 250 13  % Time Below target 5     Glycemic patterns summary: hyperglycemia through the day  Hyperglycemic episodes  Through the day   Hypoglycemic episodes occurred early morning and sometimes during the day  Overnight periods: trends down   Preprandial periods: variable     DIABETIC COMPLICATIONS: Microvascular complications:    Denies:  Neuropathy, retinopathy,   Last Eye Exam: Completed years ago   Macrovascular complications:    Denies: CAD, CVA,  PVD   HISTORY:  Past Medical History:  Past Medical History:  Diagnosis Date  . Diabetes mellitus without complication [facility])     Past Surgical History: No past surgical history on file.  Social History:  reports that he has quit smoking. He has never used smokeless tobacco. He reports current alcohol use. No history on file for drug use. Family History:  Family History  Problem Relation Age of Onset  . Diabetes type I Sister      HOME MEDICATIONS: Allergies as of 12/26/2019      Reactions   Tomato Swelling   Banana    GI upset    Other    Blue cheese---Swelling       Medication List       Accurate as of December 26, 2019  1:21 PM. If you have any questions, ask your nurse or doctor.        Dexcom G6 Receiver Devi 1 Device by Does not apply route as directed.   Dexcom G6 Sensor Misc 1 Device by Does not apply route as directed.   Dexcom G6 Transmitter Misc 1 Device by Does not apply route as directed.   glucagon 1 MG injection Inject 1 mg into the muscle once as needed.   Insulin Pen Needle 32G X 4 MM Misc 1 Device by Does not apply route 4 (four) times daily.   NovoLOG FlexPen 100 UNIT/ML FlexPen Generic drug: insulin aspart Max  daily 90 u/day   Tresiba FlexTouch 100 UNIT/ML FlexTouch Pen Generic drug: insulin degludec Inject 0.12 mLs (12 Units total) into the skin daily.        OBJECTIVE:   Vital Signs: There were no vitals taken for this visit.  Wt Readings from Last 3 Encounters:  09/23/19 154 lb 12.8 oz (70.2 kg)  06/18/19 153 lb 3.2 oz (69.5 kg)  09/04/18 155 lb 3.2 oz (70.4 kg)     Exam: General: Pt appears well and is in NAD  Neck: General: Supple without adenopathy. Thyroid: Thyroid size normal.  No goiter or nodules appreciated. No thyroid bruit.  Lungs: Clear with good BS bilat with no rales, rhonchi, or wheezes  Heart: RRR with normal S1 and S2 and no gallops; no murmurs; no rub  Abdomen: Normoactive bowel sounds, soft, nontender,  without masses or organomegaly palpable  Extremities: No pretibial edema. No tremor. Normal strength and motion throughout. See detailed diabetic foot exam below.  Skin: Normal texture and temperature to palpation. No rash noted. No Acanthosis nigricans/skin tags. No lipohypertrophy.  Neuro: MS is good with appropriate affect, pt is alert and Ox3    DM foot exam:  09/23/2019  The skin of the feet is intact without sores or ulcerations. The pedal pulses are 2+ on right and 2+ on left. The sensation is intact to a screening 5.07, 10 gram monofilament bilaterally        DATA REVIEWED:  Lab Results  Component Value Date   HGBA1C 7.9 (A) 09/24/2019   HGBA1C 8.0 (A) 06/18/2019   HGBA1C 8.2 (A) 09/04/2018   Lab Results  Component Value Date   MICROALBUR 1.6 09/23/2019   CREATININE 0.87 09/23/2019    ASSESSMENT / PLAN / RECOMMENDATIONS:   1) Type 1 Diabetes Mellitus, Poorly controlled, With out complications - Most recent A1c of 8.0 %. Goal A1c < 7.0 %.      - I have discussed with the patient the pathophysiology of diabetes. We went over the natural progression of the disease. We talked about both insulin resistance and insulin deficiency. We stressed the importance of lifestyle changes including diet and exercise. I explained the complications associated with diabetes including retinopathy, nephropathy, neuropathy as well as increased risk of cardiovascular disease. We went over the benefit seen with glycemic control.  I explained to the patient that diabetic patients are at higher than normal risk for amputations.  - Discussed pharmacokinetics of basal/bolus insulin and the importance of taking prandial insulin with meals.  We also discussed avoiding sugar-sweetened beverages and snacks, when possible.   - Pt interested in Dexcom , he will contact his insurance to verify coverage since he already has a freestyle libre, but doesn't;t like it.  - Will obtain labs on next visit  - Pt  urged to establish care with a PCP   MEDICATIONS: - Decrease Tresiba to 10 units daily  - Novolog: Continue 1 unit for every 6 grams of carbohydrates - Correction Factor: Novolog (BG-130/50)   EDUCATION / INSTRUCTIONS:  BG monitoring instructions: Patient is instructed to check his blood sugars 4 times a day, before each meal and bedtime .  Call [facility] if: BG persistently < 70 or > 300. . I reviewed the Rule of 15 for the treatment of hypoglycemia in detail with the patient. Literature supplied.   2) Diabetic complications:   Eye: Does not have known diabetic retinopathy. He was urged to have a an eye exam soon.   Neuro/  Feet: Does not have known diabetic peripheral neuropathy .   Renal: Patient does not have known baseline CKD.  3) Lipids: No indications for statins at this time     F/U in 3 months    Signed electronically by: Lyndle Herrlich, MD  [facility]  [facility] [address] [place] [place], [place] [address] Phone: [phone] FAX: [phone]   CC: Patient, No Pcp Per No address on file Phone: None  Fax: None  Return to Endocrinology clinic as below: Future Appointments  Date Time Provider Department Center  12/26/2019  2:00 PM Keegan Bensch, Konrad Dolores, MD [facility] None

## 2020-02-09 ENCOUNTER — Other Ambulatory Visit: Payer: Self-pay

## 2020-02-09 ENCOUNTER — Ambulatory Visit (INDEPENDENT_AMBULATORY_CARE_PROVIDER_SITE_OTHER): Payer: BC Managed Care – PPO | Admitting: Internal Medicine

## 2020-02-09 ENCOUNTER — Encounter: Payer: Self-pay | Admitting: Internal Medicine

## 2020-02-09 VITALS — BP 110/70 | HR 65 | Ht 72.0 in | Wt 150.2 lb

## 2020-02-09 DIAGNOSIS — E1065 Type 1 diabetes mellitus with hyperglycemia: Secondary | ICD-10-CM | POA: Diagnosis not present

## 2020-02-09 DIAGNOSIS — E109 Type 1 diabetes mellitus without complications: Secondary | ICD-10-CM

## 2020-02-09 DIAGNOSIS — E119 Type 2 diabetes mellitus without complications: Secondary | ICD-10-CM

## 2020-02-09 LAB — POCT GLYCOSYLATED HEMOGLOBIN (HGB A1C): Hemoglobin A1C: 7.7 % — AB (ref 4.0–5.6)

## 2020-02-09 NOTE — Patient Instructions (Addendum)
-   Decrease Tresiba to 10 units daily  - Novolog: Continue 1 unit for every 6 grams of carbohydrates   - Novolog correctional insulin: ADD extra units on insulin to your meal-time Novolog dose if your blood sugars are higher than 180. Use the scale below to help guide you:   Blood sugar before meal Number of units to inject  Less than 180 0 unit  181 -  230 1 units  231-  280 2 units  281 -  330 3 units  331-  380 4 units  381 -  430 5 units  431-  480 6 units        - HOW TO TREAT LOW BLOOD SUGARS (Blood sugar LESS THAN 70 MG/DL)  Please follow the RULE OF 15 for the treatment of hypoglycemia treatment (when your (blood sugars are less than 70 mg/dL)    STEP 1: Take 15 grams of carbohydrates when your blood sugar is low, which includes:   3-4 GLUCOSE TABS  OR  3-4 OZ OF JUICE OR REGULAR SODA OR  ONE TUBE OF GLUCOSE GEL     STEP 2: RECHECK blood sugar in 15 MINUTES STEP 3: If your blood sugar is still low at the 15 minute recheck --> then, go back to STEP 1 and treat AGAIN with another 15 grams of carbohydrates.

## 2020-02-09 NOTE — Progress Notes (Signed)
Name: Scott Bush  Age/ Sex: 24 y.o., male   MRN/ DOB: [id number], 10-19-1995     PCP: Patient, No Pcp Per   Reason for Endocrinology Evaluation: Type 1 Diabetes Mellitus  Initial Endocrine Consultative Visit: 07/11/2016    PATIENT IDENTIFIER: Scott Bush is a 24 y.o. male with a past medical history of T1DM. The patient has followed with Endocrinology clinic since 07/11/2016 for consultative assistance with management of his diabetes.  DIABETIC HISTORY:  Scott Bush was diagnosed with T1DM in 2007,has hx of severe hypoglycemia in 2008. DKA at the time of diagnosis only. His hemoglobin A1c has ranged from 8.2% in 2020, peaking at 10.3% in 2019.  Sister with T1DM.  SUBJECTIVE:   During the last visit (09/23/2019): A1c 8.0 %. Adjusted  MDI regimen   Today (02/09/2020): Scott Bush is here to establish care with me. He checks his blood sugars multiple times a day through the freestyle libre. The patient has had hypoglycemic episodes since the last clinic visit, which typically occur 1 x /week - most often occuring with no pattern. The patient is symptomatic with these episodes. O  Eats 2 meals a day, snacks once a day   Works 9 AM - 5 PM supervisor      HOME DIABETES REGIMEN:  Tresiba  10 units daily- taking 12 units  Novolog I:C ratio of 1:6 - doing 1:8 Correction Factor: Novolog (BG-130/50)    CONTINUOUS GLUCOSE MONITORING RECORD INTERPRETATION    Dates of Recording: 8/10-8/23/2021   Sensor description: Dexcom  Results statistics:   CGM use % of time 100  Average and SD 178/68  Time in range   50%  % Time Above 180 32  % Time above 250 15  % Time Below target 2      Glycemic patterns summary: hyperglycemia noted post prandial   Hyperglycemic episodes  With meals   Hypoglycemic episodes occurred early morning and sometimes during the day  Overnight periods: fluctuates  Preprandial periods: variable     DIABETIC COMPLICATIONS: Microvascular complications:     Denies:  Neuropathy, retinopathy,   Last Eye Exam: Completed years ago   Macrovascular complications:    Denies: CAD, CVA, PVD   HISTORY:  Past Medical History:  Past Medical History:  Diagnosis Date  . Diabetes mellitus without complication [facility])     Past Surgical History: No past surgical history on file.  Social History:  reports that he has quit smoking. He has never used smokeless tobacco. He reports current alcohol use. No history on file for drug use. Family History:  Family History  Problem Relation Age of Onset  . Diabetes type I Sister      HOME MEDICATIONS: Allergies as of 02/09/2020      Reactions   Tomato Swelling   Banana    GI upset    Other    Blue cheese---Swelling       Medication List       Accurate as of February 09, 2020 12:49 PM. If you have any questions, ask your nurse or doctor.        Dexcom G6 Receiver Devi 1 Device by Does not apply route as directed.   Dexcom G6 Sensor Misc 1 Device by Does not apply route as directed.   Dexcom G6 Transmitter Misc 1 Device by Does not apply route as directed.   glucagon 1 MG injection Inject 1 mg into the muscle once as needed.   Insulin Pen Needle 32G X  4 MM Misc 1 Device by Does not apply route 4 (four) times daily.   NovoLOG FlexPen 100 UNIT/ML FlexPen Generic drug: insulin aspart Max daily 90 u/day   Tresiba FlexTouch 100 UNIT/ML FlexTouch Pen Generic drug: insulin degludec Inject 0.12 mLs (12 Units total) into the skin daily.        OBJECTIVE:   Vital Signs: BP 110/70 (BP Location: Left Arm, Patient Position: Sitting, Cuff Size: Normal)   Pulse 65   Ht 6' (1.829 m)   Wt 150 lb 3.2 oz (68.1 kg)   SpO2 98%   BMI 20.37 kg/m   Wt Readings from Last 3 Encounters:  09/23/19 154 lb 12.8 oz (70.2 kg)  06/18/19 153 lb 3.2 oz (69.5 kg)  09/04/18 155 lb 3.2 oz (70.4 kg)     Exam: General: Pt appears well and is in NAD  Neck: General: Supple without  adenopathy. Thyroid: Thyroid size normal.  No goiter or nodules appreciated. No thyroid bruit.  Lungs: Clear with good BS bilat with no rales, rhonchi, or wheezes  Heart: RRR with normal S1 and S2 and no gallops; no murmurs; no rub  Abdomen: Normoactive bowel sounds, soft, nontender, without masses or organomegaly palpable  Extremities: No pretibial edema. No tremor. Normal strength and motion throughout. See detailed diabetic foot exam below.  Skin: Normal texture and temperature to palpation. No rash noted. No Acanthosis nigricans/skin tags. No lipohypertrophy.  Neuro: MS is good with appropriate affect, pt is alert and Ox3    DM foot exam:  09/23/2019  The skin of the feet is intact without sores or ulcerations. The pedal pulses are 2+ on right and 2+ on left. The sensation is intact to a screening 5.07, 10 gram monofilament bilaterally        DATA REVIEWED:  Lab Results  Component Value Date   HGBA1C 7.9 (A) 09/24/2019   HGBA1C 8.0 (A) 06/18/2019   HGBA1C 8.2 (A) 09/04/2018   Lab Results  Component Value Date   MICROALBUR 1.6 09/23/2019   CREATININE 0.87 09/23/2019    ASSESSMENT / PLAN / RECOMMENDATIONS:   1) Type 1 Diabetes Mellitus, Sub-optimally controlled, With out complications - Most recent A1c of 7.7 %. Goal A1c < 7.0 %.      - Slight improvement in A1c. Unfortunately he continued the same insulin doses that he has been on for a while , despite providing him with written instructions last visit.  - He has fear of hypoglycemia and tends to correct with BG's 150 or less. Scott Bush also has been noted with over-correction hyperglycemia.  Pt encouraged to work on certain techniques to try and work on overcoming the fear, and not to over do correction. For example today he drank a bottle of gatorade at work because his BG was 150 mg/dl, due to fear of hypoglycemia while at work by the time he came to our office his Bg was ~ 250 mg/dL. I have advised him maybe in the future  drink half of the bottle rather the full one, and so on and so forth.  - I am going to again attempt to reduce his basal insulin, as this is causing fasting hypoglycemia and when he skips meals.      MEDICATIONS: - Decrease Tresiba to 10 units daily  - Novolog: change I: C ratio to  1 unit for every 6 grams of carbohydrates - Correction Factor: Novolog (BG-130/50)   EDUCATION / INSTRUCTIONS:  BG monitoring instructions: Patient is instructed to check his  blood sugars 4 times a day, before each meal and bedtime .  Call [facility] if: BG persistently < 70 . Marland Kitchen I reviewed the Rule of 15 for the treatment of hypoglycemia in detail with the patient. Literature supplied.   2) Diabetic complications:   Eye: Does not have known diabetic retinopathy.    Neuro/ Feet: Does not have known diabetic peripheral neuropathy .   Renal: Patient does not have known baseline CKD.  3) Lipids: No indications for statins at this time     F/U in 4 months    Signed electronically by: Lyndle Herrlich, MD  [facility]  [facility] [address] [place] [place], [place] [address] Phone: [phone] FAX: [phone]   CC: Patient, No Pcp Per No address on file Phone: None  Fax: None  Return to Endocrinology clinic as below: Future Appointments  Date Time Provider Department Center  02/09/2020  3:40 PM Archie Atilano, Konrad Dolores, MD [facility] None

## 2020-02-10 DIAGNOSIS — E119 Type 2 diabetes mellitus without complications: Secondary | ICD-10-CM | POA: Insufficient documentation

## 2020-02-10 DIAGNOSIS — E1065 Type 1 diabetes mellitus with hyperglycemia: Secondary | ICD-10-CM | POA: Insufficient documentation

## 2020-06-03 ENCOUNTER — Ambulatory Visit: Payer: BC Managed Care – PPO | Admitting: Internal Medicine

## 2020-06-03 NOTE — Progress Notes (Signed)
Name: Scott Bush  Age/ Sex: 24 y.o., male   MRN/ DOB: [id number], 1996-04-05     PCP: Patient, No Pcp Per   Reason for Endocrinology Evaluation: Type 1 Diabetes Mellitus  Initial Endocrine Consultative Visit: 07/11/2016    PATIENT IDENTIFIER: [name] is a 24 y.o. male with a past medical history of T1DM. The patient has followed with Endocrinology clinic since 07/11/2016 for consultative assistance with management of his diabetes.  DIABETIC HISTORY:  [name] was diagnosed with T1DM in 2007,has hx of severe hypoglycemia in 2008. DKA at the time of diagnosis only. His hemoglobin A1c has ranged from 8.2% in 2020, peaking at 10.3% in 2019.  Sister with T1DM.  SUBJECTIVE:   During the last visit (02/09/2020): A1c 7.7 %. Adjusted  MDI regimen   Today (06/04/2020): [name] is here for a follow up on diabetes management. He checks his blood sugars multiple times a day through the freestyle libre. The patient has had hypoglycemic episodes since the last clinic visit, which typically occur after bolus at night. The patient is symptomatic with these episodes. O    Works 9 AM - 5 PM supervisor    Has skin cysts noted a month ago, denies pain, no change in size    HOME DIABETES REGIMEN:  Tresiba  10 units daily Novolog I:C ratio of 1:6  Correction Factor: Novolog (BG-130/50)     CONTINUOUS GLUCOSE MONITORING RECORD INTERPRETATION    Dates of Recording: 12/4-12/17/2021   Sensor description: Dexcom  Results statistics:   CGM use % of time 93  Average and SD 197  Time in range   38%  % Time Above 180 36  % Time above 250 23  % Time Below target 2      Glycemic patterns summary: hyperglycemia at night , better BG control during the day   Hyperglycemic episodes  Dinner/ bedtime  Hypoglycemic episodes occurred after a bolus  Overnight periods: fluctuates  DIABETIC COMPLICATIONS: Microvascular complications:    Denies:  Neuropathy, retinopathy,   Last Eye Exam:  Completed years ago   Macrovascular complications:    Denies: CAD, CVA, PVD   HISTORY:  Past Medical History:  Past Medical History:  Diagnosis Date  . Diabetes mellitus without complication [facility])     Past Surgical History: No past surgical history on file.  Social History:  reports that he has quit smoking. He has never used smokeless tobacco. He reports current alcohol use. No history on file for drug use. Family History:  Family History  Problem Relation Age of Onset  . Diabetes type I Sister      HOME MEDICATIONS: Allergies as of 06/04/2020      Reactions   Tomato Swelling   Banana    GI upset    Other    Blue cheese---Swelling       Medication List       Accurate as of June 04, 2020  8:33 AM. If you have any questions, ask your nurse or doctor.        Dexcom G6 Receiver Devi 1 Device by Does not apply route as directed.   Dexcom G6 Sensor Misc 1 Device by Does not apply route as directed.   Dexcom G6 Transmitter Misc 1 Device by Does not apply route as directed.   glucagon 1 MG injection Inject 1 mg into the muscle once as needed.   Insulin Pen Needle 32G X 4 MM Misc 1 Device by Does not apply  route 4 (four) times daily.   NovoLOG FlexPen 100 UNIT/ML FlexPen Generic drug: insulin aspart Max daily 90 u/day What changed: additional instructions   Tresiba FlexTouch 100 UNIT/ML FlexTouch Pen Generic drug: insulin degludec Inject 0.12 mLs (12 Units total) into the skin daily.        OBJECTIVE:   Vital Signs: BP 112/72   Pulse 69   Ht 6' (1.829 m)   Wt 150 lb 2 oz (68.1 kg)   SpO2 97%   BMI 20.36 kg/m   Wt Readings from Last 3 Encounters:  06/04/20 150 lb 2 oz (68.1 kg)  02/09/20 150 lb 3.2 oz (68.1 kg)  09/23/19 154 lb 12.8 oz (70.2 kg)     Exam: General: Pt appears well and is in NAD  Neck: General: Supple without adenopathy. Thyroid: Thyroid size normal.  No goiter or nodules appreciated.  Lungs: Clear to ascultation   Heart: RRR   Abdomen: soft, nontender  Extremities: No pretibial edema.   Skin: No cysts were detected on the bilateral areas of concern  Neuro: MS is good with appropriate affect, pt is alert and Ox3    DM foot exam:  09/23/2019  The skin of the feet is intact without sores or ulcerations. The pedal pulses are 2+ on right and 2+ on left. The sensation is intact to a screening 5.07, 10 gram monofilament bilaterally        DATA REVIEWED:  Lab Results  Component Value Date   HGBA1C 8.3 (A) 06/04/2020   HGBA1C 7.7 (A) 02/09/2020   HGBA1C 7.9 (A) 09/24/2019   Lab Results  Component Value Date   MICROALBUR 1.6 09/23/2019   CREATININE 0.87 09/23/2019    ASSESSMENT / PLAN / RECOMMENDATIONS:   1) Type 1 Diabetes Mellitus, Sub-optimally controlled, With out complications - Most recent A1c of 8.3 %. Goal A1c < 7.0 %.      -Pt with hyperglycemia, his Bg's are doing better then they were during the day but he continues with hyperglycemia at night due to snacking, I am not sure if he doesn;t bolus correctly or not at all , but he has been noted to give himself Novolog after hyperglycemia is noted which is followed by hypoglycemia.   - He has fear of hypoglycemia and tends to correct with BG's >250  Only.   - I will adjust his correction factor - We also discussed the T-slim , and he  Is willing to talk to our CDE, concerned about the cost     MEDICATIONS: - Continue  Tresiba  10 units daily  - Novolog: change I: C ratio to  1 unit for every 6 grams of carbohydrates - Correction Factor: Novolog (BG-130/55)   EDUCATION / INSTRUCTIONS:  BG monitoring instructions: Patient is instructed to check his blood sugars 4 times a day, before each meal and bedtime .  Call [place] [facility] if: BG persistently < 70 . Marland Kitchen I reviewed the Rule of 15 for the treatment of hypoglycemia in detail with the patient. Literature supplied.   2) Diabetic complications:   Eye: Does not  have known diabetic retinopathy.  Pt advised to have an exam   Neuro/ Feet: Does not have known diabetic peripheral neuropathy .   Renal: Patient does not have known baseline CKD.  3) Lipids: No indications for statins at this time   4) Skin Issue:  - He described ?cysts on the sides of his hips but upon further examination, feels to me like a  normal muscle . Reassurance provided at this time   Pt advised to establish with PCP    F/U in 4 months    Signed electronically by: Lyndle Herrlich, MD  [facility] Endocrinology  [facility] [address] [place] [place], [place] [address] Phone: [phone] FAX: [phone]   CC: Patient, No Pcp Per No address on file Phone: None  Fax: None  Return to Endocrinology clinic as below: Future Appointments  Date Time Provider Department Center  10/14/2020 11:10 AM Lara Palinkas, Konrad Dolores, MD [facility] None

## 2020-06-04 ENCOUNTER — Ambulatory Visit (INDEPENDENT_AMBULATORY_CARE_PROVIDER_SITE_OTHER): Payer: BC Managed Care – PPO | Admitting: Internal Medicine

## 2020-06-04 ENCOUNTER — Encounter: Payer: Self-pay | Admitting: Internal Medicine

## 2020-06-04 ENCOUNTER — Other Ambulatory Visit: Payer: Self-pay

## 2020-06-04 VITALS — BP 112/72 | HR 69 | Ht 72.0 in | Wt 150.1 lb

## 2020-06-04 DIAGNOSIS — E109 Type 1 diabetes mellitus without complications: Secondary | ICD-10-CM | POA: Diagnosis not present

## 2020-06-04 DIAGNOSIS — E1065 Type 1 diabetes mellitus with hyperglycemia: Secondary | ICD-10-CM | POA: Diagnosis not present

## 2020-06-04 LAB — POCT GLYCOSYLATED HEMOGLOBIN (HGB A1C): Hemoglobin A1C: 8.3 % — AB (ref 4.0–5.6)

## 2020-06-04 MED ORDER — NOVOLOG FLEXPEN 100 UNIT/ML ~~LOC~~ SOPN: PEN_INJECTOR | SUBCUTANEOUS | 3 refills | Status: DC

## 2020-06-04 MED ORDER — INSULIN PEN NEEDLE 32G X 4 MM MISC: 1.0000 | Freq: Four times a day (QID) | 3 refills | Status: AC

## 2020-06-04 MED ORDER — TRESIBA FLEXTOUCH 100 UNIT/ML ~~LOC~~ SOPN: 10.0000 [IU] | PEN_INJECTOR | Freq: Every day | SUBCUTANEOUS | 3 refills | Status: DC

## 2020-06-04 NOTE — Patient Instructions (Addendum)
-   Decrease Tresiba to 10 units daily  - Novolog: Continue 1 unit for every 6 grams of carbohydrates   - Novolog correctional insulin: ADD extra units on insulin to your meal-time Novolog dose if your blood sugars are higher than 185. Use the scale below to help guide you:   Blood sugar before meal Number of units to inject  Less than 185 0 unit  186 -  240 1 units  241-  295 2 units  296 - 350 3 units  351-  405 4 units  406 - 460 5 units  461-  515 6 units        - HOW TO TREAT LOW BLOOD SUGARS (Blood sugar LESS THAN 70 MG/DL)  Please follow the RULE OF 15 for the treatment of hypoglycemia treatment (when your (blood sugars are less than 70 mg/dL)    STEP 1: Take 15 grams of carbohydrates when your blood sugar is low, which includes:   3-4 GLUCOSE TABS  OR  3-4 OZ OF JUICE OR REGULAR SODA OR  ONE TUBE OF GLUCOSE GEL     STEP 2: RECHECK blood sugar in 15 MINUTES STEP 3: If your blood sugar is still low at the 15 minute recheck --> then, go back to STEP 1 and treat AGAIN with another 15 grams of carbohydrates.

## 2020-06-23 ENCOUNTER — Encounter: Payer: Self-pay | Admitting: Nutrition

## 2020-06-23 ENCOUNTER — Other Ambulatory Visit: Payer: Self-pay

## 2020-06-23 ENCOUNTER — Encounter: Payer: BC Managed Care – PPO | Attending: Internal Medicine | Admitting: Nutrition

## 2020-06-23 DIAGNOSIS — E1065 Type 1 diabetes mellitus with hyperglycemia: Secondary | ICD-10-CM | POA: Diagnosis not present

## 2020-06-23 NOTE — Progress Notes (Signed)
Patient is here today to learn about insulin pump therapy. We discussed how pumps work, and the advantages and disadvantages of insulin pump therapy.  He was shown the 3 different models and we discussed advantages and disadvantages of each model.  He chose the Tandem insulin pump, and paperwork was filled out and faxed to Tandem and Dexcom.  He was given a Dexcom to start.  Lot#: [id number], exp. 5/22.  We discussed the difference between blood sugar and CGM readings and he reported good understanding of this. HE was trained on how to use this, and readings are going to his phone.  He was linked to [facility].  He had no final questions.

## 2020-06-27 NOTE — Patient Instructions (Signed)
Review brochure and directions on Dexcom and go to web site for more information Call when pump comes in for training

## 2020-08-03 ENCOUNTER — Telehealth: Payer: Self-pay | Admitting: Internal Medicine

## 2020-08-03 MED ORDER — NOVOLOG FLEXPEN 100 UNIT/ML ~~LOC~~ SOPN: PEN_INJECTOR | SUBCUTANEOUS | 11 refills | Status: AC

## 2020-08-03 MED ORDER — INSULIN LISPRO (1 UNIT DIAL) 100 UNIT/ML (KWIKPEN): PEN_INJECTOR | SUBCUTANEOUS | 11 refills | Status: DC

## 2020-08-03 NOTE — Telephone Encounter (Signed)
Please ask him to contact insurance and provide formulary. Insurance companies cover more then 1 insulin. Also , Candie Mile is an inhalational insulin and will not be a good option for him as its a fixed dose.      The above is short acting insulin   What long acting insurance covers ?

## 2020-08-03 NOTE — Telephone Encounter (Signed)
Pt called because his insurance recently switched away from his insulin Novolog and they no longer cover this anymore. Pt requests we send a prescription for Siasp because he says insurance will cover this.  Pt is all out of insulin.   [id number], [place] - [address] AT [facility] [address] Phone:  [phone]  Fax:  [phone]

## 2020-08-03 NOTE — Telephone Encounter (Addendum)
I have called [employer]. It shows that his prescription plans prefer Novolog and Fiasp. However, the problem is that the prescription have to be 90 days supply. The rep was doing calculation as 45 mL which will give patient 15 pens of the Novolog flexpen which will cost $187.00  Ok to change back. It will run him about the same price with [employer]

## 2020-08-03 NOTE — Telephone Encounter (Signed)
Humalog sent.

## 2020-08-03 NOTE — Telephone Encounter (Signed)
Spoken to patient and notified Dr Shamleffer's comments. Verbalized understanding.   

## 2020-08-03 NOTE — Telephone Encounter (Signed)
Patient states his insurance  no longer pay for this  insulin aspart (NOVOLOG FLEXPEN) 100 UNIT/ML FlexPen [[id number]]  They will pay for Fiasp insulin

## 2020-08-03 NOTE — Telephone Encounter (Signed)
Patient called and advised that Scott Bush is an injectable medication and that is what his insurance covers.  Patient has requested a call back to [phone]

## 2020-08-03 NOTE — Telephone Encounter (Signed)
Spoken to patient and notified Dr Harvel Ricks comments. Patient stated that he will call and let us know

## 2020-09-16 ENCOUNTER — Other Ambulatory Visit: Payer: Self-pay | Admitting: Internal Medicine

## 2020-10-14 ENCOUNTER — Ambulatory Visit (INDEPENDENT_AMBULATORY_CARE_PROVIDER_SITE_OTHER): Payer: BC Managed Care – PPO | Admitting: Internal Medicine

## 2020-10-14 ENCOUNTER — Other Ambulatory Visit: Payer: Self-pay

## 2020-10-14 ENCOUNTER — Telehealth: Payer: Self-pay | Admitting: Internal Medicine

## 2020-10-14 VITALS — BP 112/72 | HR 78 | Ht 72.0 in | Wt 148.0 lb

## 2020-10-14 DIAGNOSIS — E1065 Type 1 diabetes mellitus with hyperglycemia: Secondary | ICD-10-CM

## 2020-10-14 LAB — LIPID PANEL
Cholesterol: 160 mg/dL (ref 0–200)
HDL: 49.8 mg/dL (ref >39.00)
LDL Cholesterol: 95 mg/dL (ref 0–99)
NonHDL: 110.22
Total CHOL/HDL Ratio: 3
Triglycerides: 76 mg/dL (ref 0.0–149.0)
VLDL: 15.2 mg/dL (ref 0.0–40.0)

## 2020-10-14 LAB — BASIC METABOLIC PANEL
BUN: 12 mg/dL (ref 6–23)
CO2: 29 mEq/L (ref 19–32)
Calcium: 9.7 mg/dL (ref 8.4–10.5)
Chloride: 101 mEq/L (ref 96–112)
Creatinine, Ser: 0.86 mg/dL (ref 0.40–1.50)
GFR: 119.15 mL/min (ref >60.00)
Glucose, Bld: 221 mg/dL — ABNORMAL HIGH (ref 70–99)
Potassium: 4.4 mEq/L (ref 3.5–5.1)
Sodium: 134 mEq/L — ABNORMAL LOW (ref 135–145)

## 2020-10-14 LAB — MICROALBUMIN / CREATININE URINE RATIO
Creatinine,U: 185.3 mg/dL
Microalb Creat Ratio: 0.4 mg/g (ref 0.0–30.0)
Microalb, Ur: <0.7 mg/dL (ref 0.0–1.9)

## 2020-10-14 LAB — POCT GLYCOSYLATED HEMOGLOBIN (HGB A1C): Hemoglobin A1C: 7.7 % — AB (ref 4.0–5.6)

## 2020-10-14 LAB — T4, FREE: Free T4: 0.72 ng/dL (ref 0.60–1.60)

## 2020-10-14 LAB — TSH: TSH: 1.13 u[IU]/mL (ref 0.35–4.50)

## 2020-10-14 NOTE — Progress Notes (Signed)
Name: Scott Bush  Age/ Sex: 25 y.o., male   MRN/ DOB: [id number], 1995-10-11     PCP: Patient, No Pcp Per (Inactive)   Reason for Endocrinology Evaluation: Type 1 Diabetes Mellitus  Initial Endocrine Consultative Visit: 07/11/2016    PATIENT IDENTIFIER: [name] is a 25 y.o. male with a past medical history of T1DM. The patient has followed with Endocrinology clinic since 07/11/2016 for consultative assistance with management of his diabetes.  DIABETIC HISTORY:  [name] was diagnosed with T1DM in 2007,has hx of severe hypoglycemia in 2008. DKA at the time of diagnosis only. His hemoglobin A1c has ranged from 8.2% in 2020, peaking at 10.3% in 2019.  Sister with T1DM.  SUBJECTIVE:   During the last visit (06/04/2020): A1c 7.7 %. Adjusted  MDI regimen   Today (10/14/2020): Mr. [name] is here for a follow up on diabetes management. He checks his blood sugars multiple times a day through the freestyle libre. The patient has had hypoglycemic episodes since the last clinic visit, he is symptomatic with these episodes     Moving to [place] in 11/2020  HOME DIABETES REGIMEN:  Tresiba  8 units daily Novolog I:C ratio of 1:6  Correction Factor: Novolog (BG-130/55)     CONTINUOUS GLUCOSE MONITORING RECORD INTERPRETATION    Dates of Recording: 4/15-4/28/2022   Sensor description: Dexcom  Results statistics:   CGM use % of time 93  Average and SD 172  Time in range  51%  % Time Above 180 27  % Time above 250 16  % Time Below target 5      Glycemic patterns summary: hyperglycemia at night , better BG control during the day   Hyperglycemic episodes  Dinner/ bedtime  Hypoglycemic episodes occurred after a bolus  Overnight periods: fluctuates  DIABETIC COMPLICATIONS: Microvascular complications:    Denies:  Neuropathy, retinopathy,   Last Eye Exam: Completed years ago   Macrovascular complications:    Denies: CAD, CVA, PVD   HISTORY:  Past Medical History:  Past  Medical History:  Diagnosis Date  . Diabetes mellitus without complication [facility])     Past Surgical History: No past surgical history on file.  Social History:  reports that he has quit smoking. He has never used smokeless tobacco. He reports current alcohol use. No history on file for drug use. Family History:  Family History  Problem Relation Age of Onset  . Diabetes type I Sister      HOME MEDICATIONS: Allergies as of 10/14/2020      Reactions   Tomato Swelling   Banana    GI upset    Other    Blue cheese---Swelling       Medication List       Accurate as of October 14, 2020  7:15 AM. If you have any questions, ask your nurse or doctor.        Dexcom G6 Receiver Devi 1 Device by Does not apply route as directed.   Dexcom G6 Sensor Misc 1 DEVICE BY DOES NOT APPLY ROUTE AS DIRECTED.   Dexcom G6 Transmitter Misc 1 Device by Does not apply route as directed.   glucagon 1 MG injection Inject 1 mg into the muscle once as needed.   Insulin Pen Needle 32G X 4 MM Misc 1 Device by Does not apply route 4 (four) times daily.   NovoLOG FlexPen 100 UNIT/ML FlexPen Generic drug: insulin aspart Max daily 50 units   Tresiba FlexTouch 100 UNIT/ML FlexTouch Pen  Generic drug: insulin degludec Inject 10 Units into the skin daily.        OBJECTIVE:   Vital Signs: BP 112/72   Pulse 78   Ht 6' (1.829 m)   Wt 148 lb (67.1 kg)   SpO2 98%   BMI 20.07 kg/m    Wt Readings from Last 3 Encounters:  06/04/20 150 lb 2 oz (68.1 kg)  02/09/20 150 lb 3.2 oz (68.1 kg)  09/23/19 154 lb 12.8 oz (70.2 kg)     Exam: General: Pt appears well and is in NAD  Neck: General: Supple without adenopathy. Thyroid: Thyroid size normal.  No goiter or nodules appreciated.  Lungs: Clear to ascultation  Heart: RRR   Abdomen: soft, nontender  Extremities: No pretibial edema.   Skin: No cysts were detected on the bilateral areas of concern  Neuro: MS is good with appropriate affect, pt is  alert and Ox3    DM foot exam:   10/14/2020  The skin of the feet is intact without sores or ulcerations. The pedal pulses are 2+ on right and 2+ on left. The sensation is intact to a screening 5.07, 10 gram monofilament bilaterally        DATA REVIEWED:  Lab Results  Component Value Date   HGBA1C 8.3 (A) 06/04/2020   HGBA1C 7.7 (A) 02/09/2020   HGBA1C 7.9 (A) 09/24/2019   Results for [name], [name] (MRN [id number]) as of 10/15/2020 12:30  Ref. Range 10/14/2020 11:51  Sodium Latest Ref Range: 135 - 145 mEq/L 134 (L)  Potassium Latest Ref Range: 3.5 - 5.1 mEq/L 4.4  Chloride Latest Ref Range: 96 - 112 mEq/L 101  CO2 Latest Ref Range: 19 - 32 mEq/L 29  Glucose Latest Ref Range: 70 - 99 mg/dL 221 (H)  BUN Latest Ref Range: 6 - 23 mg/dL 12  Creatinine Latest Ref Range: 0.40 - 1.50 mg/dL 0.86  Calcium Latest Ref Range: 8.4 - 10.5 mg/dL 9.7  GFR Latest Ref Range: >60.00 mL/min 119.15  Total CHOL/HDL Ratio Unknown 3  Cholesterol Latest Ref Range: 0 - 200 mg/dL 160  HDL Cholesterol Latest Ref Range: >39.00 mg/dL 49.80  LDL (calc) Latest Ref Range: 0 - 99 mg/dL 95  MICROALB/CREAT RATIO Latest Ref Range: 0.0 - 30.0 mg/g 0.4  NonHDL Unknown 110.22  Triglycerides Latest Ref Range: 0.0 - 149.0 mg/dL 76.0  VLDL Latest Ref Range: 0.0 - 40.0 mg/dL 15.2  TSH Latest Ref Range: 0.35 - 4.50 uIU/mL 1.13  T4,Free(Direct) Latest Ref Range: 0.60 - 1.60 ng/dL 0.72  Creatinine,U Latest Units: mg/dL 185.3  Microalb, Ur Latest Ref Range: 0.0 - 1.9 mg/dL <0.7    ASSESSMENT / PLAN / RECOMMENDATIONS:   1) Type 1 Diabetes Mellitus, Sub-optimally controlled, With out complications - Most recent A1c of 7.7%. Goal A1c < 7.0 %.     - A1c improving but he continues with glucose excursions, he continues with fasting hypoglycemia and following a bolus at times. I will reduce his basal further for now.  -- He has fear of hypoglycemia and tends to correct with BG's >250  Only.   - He met with our CDE in 06/2020  to discuss a T-slim but he has not received anything of the pump supplies yet    MEDICATIONS: - Decrease  Tresiba 7 units daily  - Novolog: change I: C ratio to  1 unit for every 6 grams of carbohydrates - Correction Factor: Novolog (BG-130/55)   EDUCATION / INSTRUCTIONS:  BG monitoring instructions: Patient is  instructed to check his blood sugars 4 times a day, before each meal and bedtime .  Call [place] [facility] if: BG persistently < 70 . Marland Kitchen I reviewed the Rule of 15 for the treatment of hypoglycemia in detail with the patient. Literature supplied.   2) Diabetic complications:   Eye: Does not have known diabetic retinopathy.  Pt advised to have an exam   Neuro/ Feet: Does not have known diabetic peripheral neuropathy .   Renal: Patient does not have known baseline CKD.  3) Lipids: No indications for statins at this time      Pt moving to [place] in 11/2020   Signed electronically by: Mack Guise, MD  [facility] Endocrinology  [facility] [address] [place], [place] [address] Phone: [phone] FAX: [phone]   CC: Patient, No Pcp Per (Inactive) No address on file Phone: None  Fax: None  Return to Endocrinology clinic as below: Future Appointments  Date Time Provider [place]  10/14/2020 11:10 AM Dejion Grillo, Melanie Crazier, MD [facility] None

## 2020-10-14 NOTE — Patient Instructions (Signed)
-   Decrease Tresiba to 7 units daily  - Novolog: Continue 1 unit for every 6 grams of carbohydrates   - Novolog correctional insulin: ADD extra units on insulin to your meal-time Novolog dose if your blood sugars are higher than 185. Use the scale below to help guide you:   Blood sugar before meal Number of units to inject  Less than 185 0 unit  186 -  240 1 units  241-  295 2 units  296 - 350 3 units  351-  405 4 units  406 - 460 5 units  461-  515 6 units        - HOW TO TREAT LOW BLOOD SUGARS (Blood sugar LESS THAN 70 MG/DL)  Please follow the RULE OF 15 for the treatment of hypoglycemia treatment (when your (blood sugars are less than 70 mg/dL)    STEP 1: Take 15 grams of carbohydrates when your blood sugar is low, which includes:   3-4 GLUCOSE TABS  OR  3-4 OZ OF JUICE OR REGULAR SODA OR  ONE TUBE OF GLUCOSE GEL     STEP 2: RECHECK blood sugar in 15 MINUTES STEP 3: If your blood sugar is still low at the 15 minute recheck --> then, go back to STEP 1 and treat AGAIN with another 15 grams of carbohydrates.

## 2020-10-14 NOTE — Telephone Encounter (Signed)
Hi Bonita Quin,    You saw Mr. Pence back in January for pump discussion and he opted for the Tandem, He has not gotten anything from them .  Can you please follow up on this ?  He is moving to [place] in June and would like to get this started sooner then later    Thanks   Abby Raelyn Mora, MD  [facility] Endocrinology  [facility] [address] [place], [place] [address] Phone: [phone] FAX: [phone]

## 2020-10-15 ENCOUNTER — Encounter: Payer: Self-pay | Admitting: Internal Medicine

## 2020-10-18 NOTE — Telephone Encounter (Signed)
LVM to call that his paperwork was faxed with a confirmation on 06/23/20.  He was given the number to customer care, as well as Chris's number for follow-up.

## 2020-10-28 IMAGING — DX DG CLAVICLE*R*
2 series · 2 of 2 positions shown · non-contrast
Comparison: None.

CLINICAL DATA: Mass under right clavicle.  No injury

EXAM:
RIGHT CLAVICLE - 2+ VIEWS

[clavicle ap]
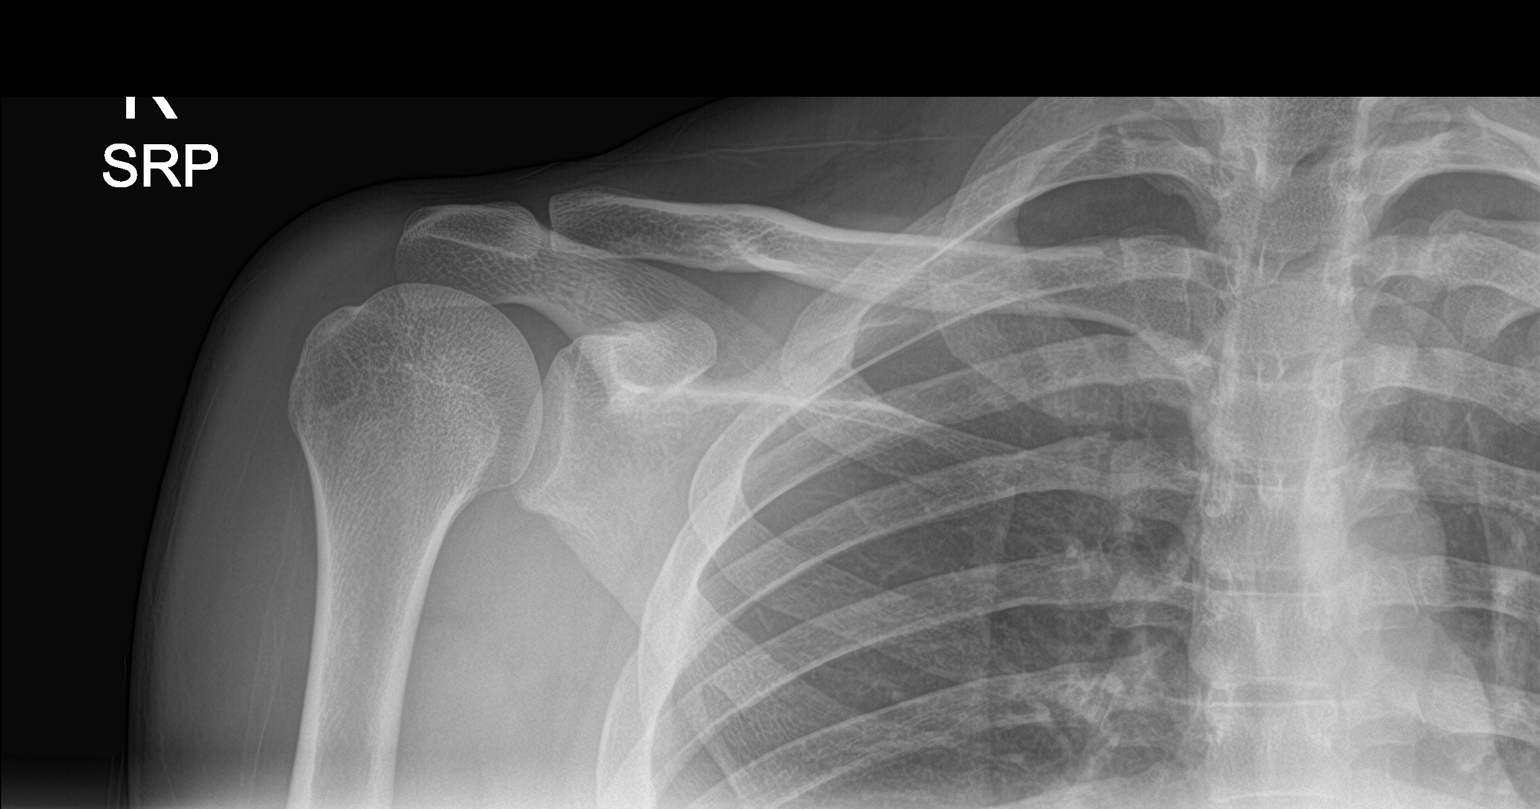

[clavicle axial]
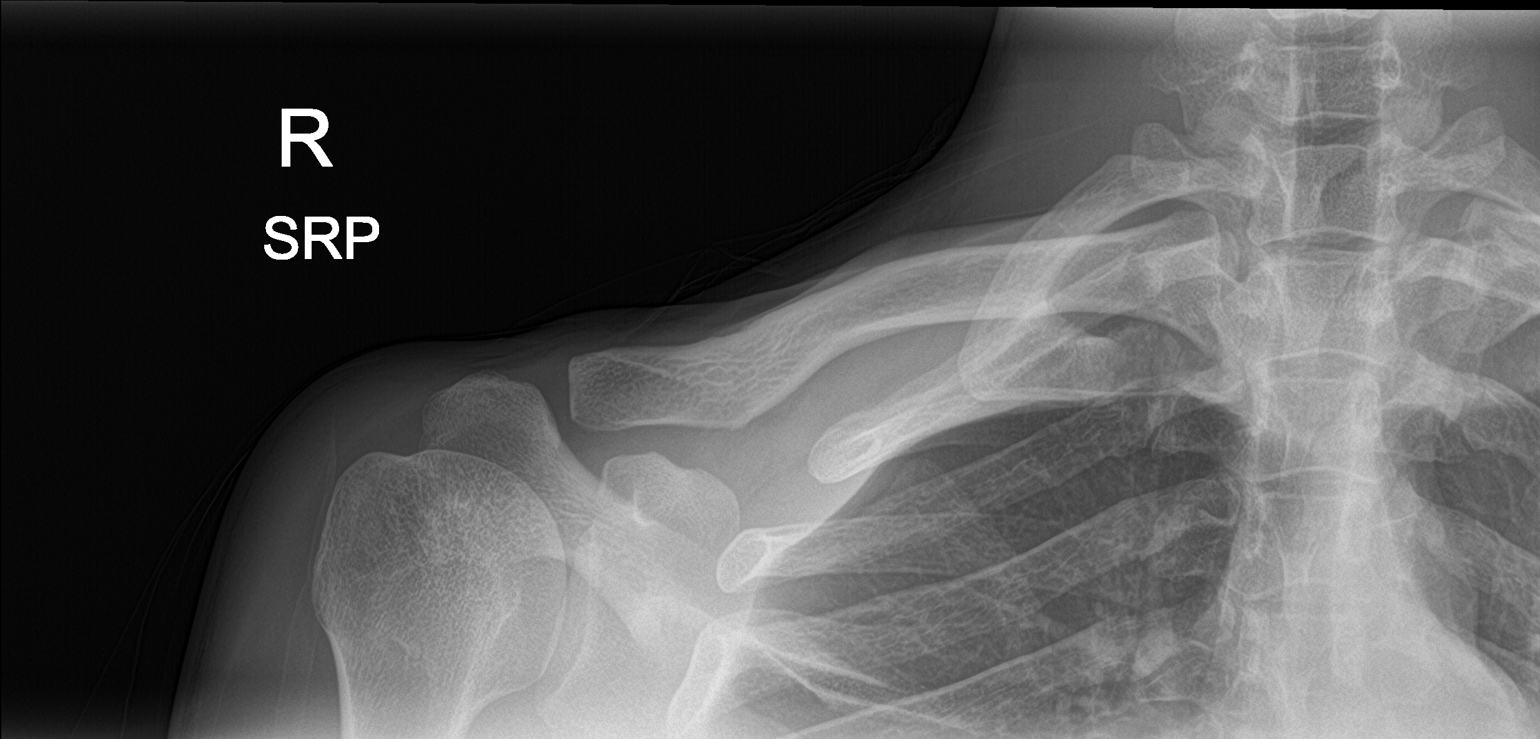

[2 of 2 positions shown; findings below may reference images not displayed]

FINDINGS: There is no evidence of fracture or other focal bone lesions. Soft
tissues are unremarkable.
IMPRESSION: Negative.

## 2020-11-07 ENCOUNTER — Other Ambulatory Visit: Payer: Self-pay | Admitting: Internal Medicine

## 2020-11-30 ENCOUNTER — Other Ambulatory Visit: Payer: Self-pay | Admitting: Internal Medicine

## 2020-12-03 ENCOUNTER — Telehealth: Payer: Self-pay | Admitting: Internal Medicine

## 2020-12-03 MED ORDER — DEXCOM G6 SENSOR MISC: 2 refills | Status: AC

## 2020-12-03 NOTE — Telephone Encounter (Signed)
Dexcom G6 transmitter [facility]  Will refill Dexcom G6 sensors.

## 2020-12-03 NOTE — Telephone Encounter (Signed)
Patient called asking if we could send in his Dexcom G6 transmitter and sensor today? He is leaving town later on and he says his expires today.   [facility]acy[facility]  [id number], [place] - [address] AT [facility] [address]  [address] [place], [place] [address]  Phone:  [phone]  Fax:  [phone]

## 2021-03-22 ENCOUNTER — Other Ambulatory Visit (HOSPITAL_COMMUNITY): Payer: Self-pay

## 2021-03-22 ENCOUNTER — Telehealth: Payer: Self-pay | Admitting: Pharmacy Technician

## 2021-03-22 NOTE — Telephone Encounter (Signed)
Patient Advocate Encounter   Received notification from CoverMyMeds that prior authorization for Dexcom is required.   PA submitted on 03/22/21 Key [id number] Status is pending  Prior Authorization has been approved.    PA# PA Case ID: [id number] Effective dates: 03/22/21 through 03/22/22  Patients co-pay is $45 each for 3 sensors and 1 transmitter.   Spoke with pt. He will contact Pharmacy to have it filled and/or call office with mail order pharmacy info.   Sherilyn Dacosta, CPhT Patient Advocate [facility] Phone: [phone] Fax:  [phone]

## 2021-03-22 NOTE — Telephone Encounter (Signed)
Patient Advocate Encounter   Left HIPAA compliant Voice message that I need his pharmacy insurance information in order to request the prior-authorization.  Received notification from Cover My Meds that prior authorization for Dexcom is required.  Sherilyn Dacosta, CPhT Patient Advocate [facility] Phone: [phone] Fax:  [phone]

## 2021-03-25 ENCOUNTER — Telehealth: Payer: Self-pay | Admitting: Pharmacy Technician

## 2021-03-25 NOTE — Telephone Encounter (Signed)
Patient Advocate Encounter   Received notification from COVERMYMEDS that prior authorization for [facility] TRANSMITTER is required.   PA submitted on 03/25/21 Key [id number] Status is pending    [facility] will continue to follow  Montez Morita, CPhT Patient Advocate [facility] Phone: [phone] Fax:  [phone]

## 2021-12-08 ENCOUNTER — Telehealth: Payer: BLUE CROSS/BLUE SHIELD

## 2021-12-08 NOTE — Telephone Encounter
LDM to pt to call office to rescdl appt for either the 11th or the 18th of July, mychart not active - AF06/22/2023

## 2021-12-15 NOTE — Telephone Encounter
LDM to pt to call office to rescdl appt for either the 11th or the 18th of July, mychart not active - AF06/29/2023

## 2021-12-26 ENCOUNTER — Ambulatory Visit: Payer: BLUE CROSS/BLUE SHIELD | Attending: "Endocrinology

## 2022-03-13 ENCOUNTER — Other Ambulatory Visit (HOSPITAL_COMMUNITY): Payer: Self-pay

## 2022-05-02 ENCOUNTER — Telehealth: Payer: BLUE CROSS/BLUE SHIELD

## 2022-05-02 NOTE — Telephone Encounter
Message to Practice/Provider      Message: Patient called in and advised he has [facility].   He was advised a referral and OTA will be needed for his upcoming appt with Dr Maurice March.   He did not have his ins information with him but he will call back to provide it.   He was provided with fax number to have PCP fax referral to [place].   Thank you.     Return call is not being requested by the patient or caller.    Patient or caller has been notified of the turnaround time of 1-2 business day(s).

## 2022-05-03 NOTE — Telephone Encounter
Message below noted.  Pt's coming up appt in next week on 05/10/2022.

## 2022-05-09 NOTE — Telephone Encounter
Under appt notes, pt cancelled appt for 05/10/22 for new pt appt.  Vml for pt that if would like to r/s to call our office and w/insurance information.

## 2022-05-10 ENCOUNTER — Ambulatory Visit: Payer: BLUE CROSS/BLUE SHIELD | Attending: "Endocrinology
